# Patient Record
Sex: Female | Born: 1970 | Race: White | Hispanic: No | Marital: Married | Smoking: Never smoker
Health system: Southern US, Community
[De-identification: ages and names within clinical notes are randomized; demographics above are authoritative.]

## PROBLEM LIST (undated history)

## (undated) DIAGNOSIS — G43909 Migraine, unspecified, not intractable, without status migrainosus: Secondary | ICD-10-CM

## (undated) DIAGNOSIS — J45909 Unspecified asthma, uncomplicated: Secondary | ICD-10-CM

## (undated) DIAGNOSIS — K589 Irritable bowel syndrome without diarrhea: Secondary | ICD-10-CM

## (undated) DIAGNOSIS — J309 Allergic rhinitis, unspecified: Secondary | ICD-10-CM

## (undated) HISTORY — PX: BREAST LUMPECTOMY: SHX2

## (undated) HISTORY — PX: MASTECTOMY: SHX3

## (undated) HISTORY — DX: Irritable bowel syndrome, unspecified: K58.9

## (undated) HISTORY — PX: BREAST RECONSTRUCTION WITH PLACEMENT OF TISSUE EXPANDER AND FLEX HD (ACELLULAR HYDRATED DERMIS): SHX6295

## (undated) HISTORY — PX: NASAL SEPTUM SURGERY: SHX37

## (undated) HISTORY — DX: Unspecified asthma, uncomplicated: J45.909

## (undated) HISTORY — DX: Migraine, unspecified, not intractable, without status migrainosus: G43.909

## (undated) HISTORY — PX: CHOLECYSTECTOMY: SHX55

## (undated) HISTORY — PX: BREAST BIOPSY: SHX20

## (undated) HISTORY — PX: NASAL TURBINATE REDUCTION: SHX2072

## (undated) HISTORY — PX: KNEE SURGERY: SHX244

## (undated) HISTORY — DX: Allergic rhinitis, unspecified: J30.9

## (undated) HISTORY — PX: SHOULDER SURGERY: SHX246

---

## 1999-05-06 ENCOUNTER — Inpatient Hospital Stay (HOSPITAL_COMMUNITY): Admission: AD | Admit: 1999-05-06 | Discharge: 1999-05-08 | Payer: Self-pay | Admitting: *Deleted

## 1999-06-21 ENCOUNTER — Other Ambulatory Visit: Admission: RE | Admit: 1999-06-21 | Discharge: 1999-06-21 | Payer: Self-pay | Admitting: Obstetrics and Gynecology

## 2000-07-02 ENCOUNTER — Other Ambulatory Visit: Admission: RE | Admit: 2000-07-02 | Discharge: 2000-07-02 | Payer: Self-pay | Admitting: Obstetrics and Gynecology

## 2001-08-07 ENCOUNTER — Other Ambulatory Visit: Admission: RE | Admit: 2001-08-07 | Discharge: 2001-08-07 | Payer: Self-pay | Admitting: Obstetrics and Gynecology

## 2002-02-04 ENCOUNTER — Encounter: Admission: RE | Admit: 2002-02-04 | Discharge: 2002-02-04 | Payer: Self-pay | Admitting: Obstetrics and Gynecology

## 2002-02-04 ENCOUNTER — Encounter: Payer: Self-pay | Admitting: Obstetrics and Gynecology

## 2002-09-18 ENCOUNTER — Other Ambulatory Visit: Admission: RE | Admit: 2002-09-18 | Discharge: 2002-09-18 | Payer: Self-pay | Admitting: Obstetrics and Gynecology

## 2002-10-04 ENCOUNTER — Emergency Department (HOSPITAL_COMMUNITY): Admission: EM | Admit: 2002-10-04 | Discharge: 2002-10-04 | Payer: Self-pay | Admitting: Emergency Medicine

## 2003-08-10 ENCOUNTER — Emergency Department (HOSPITAL_COMMUNITY): Admission: EM | Admit: 2003-08-10 | Discharge: 2003-08-10 | Payer: Self-pay | Admitting: Emergency Medicine

## 2003-08-10 ENCOUNTER — Encounter: Payer: Self-pay | Admitting: Emergency Medicine

## 2003-09-29 ENCOUNTER — Other Ambulatory Visit: Admission: RE | Admit: 2003-09-29 | Discharge: 2003-09-29 | Payer: Self-pay | Admitting: Obstetrics and Gynecology

## 2004-12-04 ENCOUNTER — Other Ambulatory Visit: Admission: RE | Admit: 2004-12-04 | Discharge: 2004-12-04 | Payer: Self-pay | Admitting: Obstetrics and Gynecology

## 2006-01-08 ENCOUNTER — Other Ambulatory Visit: Admission: RE | Admit: 2006-01-08 | Discharge: 2006-01-08 | Payer: Self-pay | Admitting: Obstetrics and Gynecology

## 2009-08-05 ENCOUNTER — Encounter (INDEPENDENT_AMBULATORY_CARE_PROVIDER_SITE_OTHER): Payer: Self-pay | Admitting: General Surgery

## 2009-08-05 ENCOUNTER — Ambulatory Visit (HOSPITAL_COMMUNITY): Admission: RE | Admit: 2009-08-05 | Discharge: 2009-08-06 | Payer: Self-pay | Admitting: General Surgery

## 2009-10-25 ENCOUNTER — Encounter: Admission: RE | Admit: 2009-10-25 | Discharge: 2009-10-25 | Payer: Self-pay | Admitting: Orthopedic Surgery

## 2011-03-31 LAB — DIFFERENTIAL
Basophils Absolute: 0 10*3/uL (ref 0.0–0.1)
Basophils Relative: 1 % (ref 0–1)
Monocytes Relative: 9 % (ref 3–12)
Neutro Abs: 2.4 10*3/uL (ref 1.7–7.7)
Neutrophils Relative %: 58 % (ref 43–77)

## 2011-03-31 LAB — COMPREHENSIVE METABOLIC PANEL
ALT: 8 U/L (ref 0–35)
AST: 20 U/L (ref 0–37)
Albumin: 4.1 g/dL (ref 3.5–5.2)
Alkaline Phosphatase: 52 U/L (ref 39–117)
BUN: 9 mg/dL (ref 6–23)
CO2: 26 mEq/L (ref 19–32)
Calcium: 9.1 mg/dL (ref 8.4–10.5)
Chloride: 108 mEq/L (ref 96–112)
Creatinine, Ser: 0.82 mg/dL (ref 0.4–1.2)
GFR calc Af Amer: >60 mL/min (ref >60)
GFR calc non Af Amer: >60 mL/min (ref >60)
Glucose, Bld: 87 mg/dL (ref 70–99)
Potassium: 4 mEq/L (ref 3.5–5.1)
Sodium: 140 mEq/L (ref 135–145)
Total Bilirubin: 0.8 mg/dL (ref 0.3–1.2)
Total Protein: 6.5 g/dL (ref 6.0–8.3)

## 2011-03-31 LAB — CBC
HCT: 40.5 % (ref 36.0–46.0)
Hemoglobin: 14 g/dL (ref 12.0–15.0)
MCHC: 34.5 g/dL (ref 30.0–36.0)
MCV: 87.8 fL (ref 78.0–100.0)
Platelets: 185 10*3/uL (ref 150–400)
RBC: 4.61 MIL/uL (ref 3.87–5.11)
RDW: 12.5 % (ref 11.5–15.5)
WBC: 4.2 10*3/uL (ref 4.0–10.5)

## 2011-05-08 NOTE — Op Note (Signed)
NAMEPRINCETTA, Tammy Stuart                ACCOUNT NO.:  [id number]   MEDICAL RECORD NO.:  [id number]          PATIENT TYPE:  OIB   LOCATION:  5156                         FACILITY:  [facility]   PHYSICIAN:  Ollen Gross. Vernell Morgans, M.D. DATE OF BIRTH:  07/24/1971   DATE OF PROCEDURE:  08/05/2009  DATE OF DISCHARGE:                               OPERATIVE REPORT   PREOPERATIVE DIAGNOSIS:  Biliary dyskinesia.   POSTOPERATIVE DIAGNOSIS:  Biliary dyskinesia.   PROCEDURE:  Laparoscopic cholecystectomy with intraoperative  cholangiogram.   SURGEON:  Ollen Gross. Vernell Morgans, MD   ASSISTANT:  Velora Heckler, MD   ANESTHESIA:  General endotracheal.   PROCEDURE IN DETAIL:  After informed consent was obtained, the patient  was brought to the operating room, placed in the supine position on the  operating table.  After adequate induction of general anesthesia, the  patient's abdomen was prepped with Betadine and draped in usual sterile  manner.  The area below the umbilicus was infiltrated with 0.25%  Marcaine.  A small incision was made with a #15 blade knife.  This  incision was carried down through the subcutaneous tissue bluntly with  hemostat and [employer] retractors until the linea alba was identified.  The linea alba was incised with a #15 blade knife.  Each side was  grasped with Kocher clamps and elevated anteriorly.  The preperitoneal  space was then probed bluntly with a hemostat until the peritoneum was  opened and access was gained to the abdominal cavity.  A 0 Vicryl  pursestring stitch was placed in the fascia around the opening.  Hasson  cannula was placed through the opening and anchored in place with a  previously placed Vicryl pursestring stitch.  The abdomen was then  insufflated with carbon dioxide without difficulty.  The patient was  placed in reverse Trendelenburg position and rotated with the right side  up.  The laparoscope was inserted through the Hasson cannula and the  right upper  quadrant was inspected.  The dome of the gallbladder and  liver were readily identified.  Next, the epigastric region was  infiltrated with 0.25% Marcaine.  A small incision was made with #15  blade knife.  A 10-mm port was placed bluntly through this incision into  the abdominal cavity under direct vision.  Sites were then chosen  laterally on the right side of the abdomen for placement of 5-mm ports.  Each of these areas were infiltrated with 0.25% Marcaine.  Small stab  incisions were made with #15 blade knife.  5 mm ports were then placed  bluntly through these incisions into the abdominal cavity under direct  vision.  Blunt grasper was placed through the lateral most 5-mm port and  used to grasp the dome of the gallbladder and elevated anteriorly and  superiorly.  Another blunt grasper was placed through the other 5-mm  port and used to retract on the body and neck of the gallbladder.  A  dissector was placed through the epigastric port.  Using the  electrocautery, the peritoneal reflection at the gallbladder neck area  was opened.  Blunt dissection was then carried out in this area until  the gallbladder neck cystic duct junction was readily identified and a  good window was created.  Single clip was placed on the gallbladder  neck.  A small ductotomy was made just below the clip with a  laparoscopic scissors.  A 14-gauge angiocath was placed percutaneously  through the anterior abdominal wall under direct vision.  A Reddick  cholangiogram catheter was placed through the angiocath and flushed.  The Reddick catheter was then placed within the cystic duct and anchored  in place with a clip.  Cholangiogram was obtained that showed no filling  defects, good emptying in the duodenum, and adequate length on the  cystic duct.  The anchoring clip and catheters were removed from the  patient.  Three clips were placed proximally on the cystic duct and duct  was divided between the two sets of  clips.  Posterior to this, the  cystic artery was identified and again dissected bluntly in a  circumferential manner until a good window was created.  Two clips were  placed proximally in the artery and one distally, the artery was divided  between the two.  Next, the laparoscopic hook cautery device was used to  separate the gallbladder from the liver bed.  Prior to completely  detaching the gallbladder from the liver bed, the liver bed was  inspected and it appeared to be hemostatic.  The gallbladder was then  detached and wrested away from the liver bed without difficulty.  Laparoscopic bag was inserted through this epigastric port.  The  gallbladder was placed in the bag and the bag was sealed.  The abdomen  was then irrigated with copious amounts of saline until the effluent was  clear.  The patient's lower abdomen was inspected and there did not  appear to be any visual evidence of hernia.  The laparoscope was then  moved to the epigastric port.  A gallbladder grasper was placed through  the Hasson cannula and used to grasp the opening of the bag.  The bag  with the gallbladder was removed through the infraumbilical port with  the Hasson cannula without difficulty.  The fascial defect was closed  with previously placed Vicryl purse-string stitch as well as with  another figure-of-eight 0 Vicryl stitch.  The rest of the ports were  then removed under direct vision and were found to be hemostatic.  The  gas was allowed to escape.  The skin incisions were all closed with  interrupted 4-0 Monocryl subcuticular stitches.  Dermabond dressings  were applied.  The patient tolerated the procedure well.  At the end of  the case, all needle, sponge, and instrument counts were correct.  The  patient was then awakened and taken to recovery in stable condition.      Ollen Gross. Vernell Morgans, M.D.  Electronically Signed     PST/MEDQ  D:  08/05/2009  T:  08/06/2009  Job:  [id number]

## 2017-03-12 ENCOUNTER — Encounter: Payer: Self-pay | Admitting: Neurology

## 2017-03-12 ENCOUNTER — Other Ambulatory Visit: Payer: Self-pay | Admitting: *Deleted

## 2017-03-12 DIAGNOSIS — G5601 Carpal tunnel syndrome, right upper limb: Secondary | ICD-10-CM

## 2017-03-26 ENCOUNTER — Ambulatory Visit (INDEPENDENT_AMBULATORY_CARE_PROVIDER_SITE_OTHER): Payer: BLUE CROSS/BLUE SHIELD | Admitting: Neurology

## 2017-03-26 DIAGNOSIS — R202 Paresthesia of skin: Secondary | ICD-10-CM

## 2017-03-26 DIAGNOSIS — G5601 Carpal tunnel syndrome, right upper limb: Secondary | ICD-10-CM | POA: Diagnosis not present

## 2017-03-26 NOTE — Procedures (Signed)
[facility]  [address] [place], [address]  [place], [place] [address] Tel: [phone] Fax:  [phone] Test Date:  03/26/2017  Patient: Tammy Stuart DOB: 05/26/1971 Physician: Nita Sickle, DO  Sex: Female Height:  Ref Phys: Burnell Blanks, M.D.  ID#: [id number] Temp: 33.6C Technician:    Patient Complaints: This is a 46 year-old female referred for evaluation of right elbow pain and numbness involving the hand.  NCV & EMG Findings: Extensive electrodiagnostic testing of the right upper extremity shows:  1. Right median, ulnar, dorsal ulnar cutaneous, and mixed palmer sensory responses are within normal limits. 2. Right median and ulnar motor responses are within normal limits. 3. There is no evidence of active or chronic motor axon loss changes affecting any of the tested muscles. Motor unit configuration and recruitment pattern is within normal limits.  Impression: This is a normal study of the right upper extremity.  In particular, there is no evidence of an ulnar neuropathy, carpal tunnel syndrome, or cervical radiculopathy affecting the right upper extremity.   ___________________________ Nita Sickle, DO    Nerve Conduction Studies Anti Sensory Summary Table   Stim Site NR Peak (ms) Norm Peak (ms) P-T Amp (V) Norm P-T Amp  Right DorsCutan Anti Sensory (Dorsum 5th MC)  Wrist    1.9 <3.1 24.4 >12  Right Median Anti Sensory (2nd Digit)  Wrist    3.2 <3.4 42.0 >20  Right Ulnar Anti Sensory (5th Digit)  Wrist    2.9 <3.1 34.8 >12   Motor Summary Table   Stim Site NR Onset (ms) Norm Onset (ms) O-P Amp (mV) Norm O-P Amp Site1 Site2 Delta-0 (ms) Dist (cm) Vel (m/s) Norm Vel (m/s)  Right Median Motor (Abd Poll Brev)  Wrist    2.9 <3.9 13.5 >6 Elbow Wrist 4.7 27.5 59 >50  Elbow    7.6  13.5         Right Ulnar Motor (Abd Dig Minimi)  Wrist    2.4 <3.1 11.3 >7 B Elbow Wrist 4.1 23.0 56 >50  B Elbow    6.5  11.3  A Elbow B Elbow 1.7 10.0 59 >50  A Elbow     8.2  10.0          Comparison Summary Table   Stim Site NR Peak (ms) Norm Peak (ms) P-T Amp (V) Site1 Site2 Delta-P (ms) Norm Delta (ms)  Right Median/Ulnar Palm Comparison (Wrist - 8cm)  Median Palm    1.8 <2.2 62.8 Median Palm Ulnar Palm 0.0   Ulnar Palm    1.8 <2.2 26.4       EMG   Side Muscle Ins Act Fibs Psw Fasc Number Recrt Dur Dur. Amp Amp. Poly Poly. Comment  Right 1stDorInt Nml Nml Nml Nml Nml Nml Nml Nml Nml Nml Nml Nml N/A  Right Ext Indicis Nml Nml Nml Nml Nml Nml Nml Nml Nml Nml Nml Nml N/A  Right PronatorTeres Nml Nml Nml Nml Nml Nml Nml Nml Nml Nml Nml Nml N/A  Right Biceps Nml Nml Nml Nml Nml Nml Nml Nml Nml Nml Nml Nml N/A  Right Triceps Nml Nml Nml Nml Nml Nml Nml Nml Nml Nml Nml Nml N/A  Right Deltoid Nml Nml Nml Nml Nml Nml Nml Nml Nml Nml Nml Nml N/A  Right ABD Dig Min Nml Nml Nml Nml Nml Nml Nml Nml Nml Nml Nml Nml N/A  Right FlexDigProf 4,5 Nml Nml Nml Nml Nml Nml Nml Nml Nml Nml Nml  Nml N/A      Waveforms:

## 2017-10-25 DIAGNOSIS — R072 Precordial pain: Secondary | ICD-10-CM

## 2017-10-25 HISTORY — DX: Precordial pain: R07.2

## 2017-10-28 ENCOUNTER — Ambulatory Visit: Payer: BLUE CROSS/BLUE SHIELD | Admitting: Cardiology

## 2017-10-28 ENCOUNTER — Encounter: Payer: Self-pay | Admitting: Cardiology

## 2017-10-28 DIAGNOSIS — F5101 Primary insomnia: Secondary | ICD-10-CM

## 2017-10-28 DIAGNOSIS — R0600 Dyspnea, unspecified: Secondary | ICD-10-CM

## 2017-10-28 DIAGNOSIS — R0609 Other forms of dyspnea: Secondary | ICD-10-CM | POA: Diagnosis not present

## 2017-10-28 DIAGNOSIS — R0789 Other chest pain: Secondary | ICD-10-CM | POA: Diagnosis not present

## 2017-10-28 DIAGNOSIS — G4700 Insomnia, unspecified: Secondary | ICD-10-CM

## 2017-10-28 DIAGNOSIS — Z9013 Acquired absence of bilateral breasts and nipples: Secondary | ICD-10-CM | POA: Diagnosis not present

## 2017-10-28 HISTORY — DX: Acquired absence of bilateral breasts and nipples: Z90.13

## 2017-10-28 HISTORY — DX: Dyspnea, unspecified: R06.00

## 2017-10-28 HISTORY — DX: Other forms of dyspnea: R06.09

## 2017-10-28 HISTORY — DX: Insomnia, unspecified: G47.00

## 2017-10-28 HISTORY — DX: Other chest pain: R07.89

## 2017-10-28 NOTE — Progress Notes (Signed)
Cardiology Consultation:    Date:  10/28/2017   ID:  Tammy Stuart, DOB 07-27-1971, MRN [id number]  PCP:  Nonnie DoneSlatosky, John J., MD  Cardiologist:  Gypsy Balsamobert Krasowski, MD   Referring MD: Nonnie DoneSlatosky, John J., MD   Chief Complaint  Patient presents with  . Chest Pain  I am having chest pain  History of Present Illness:    Tammy Stuart is a 46 y.o. female who is being seen today for the evaluation of chest pain at the request of Slatosky, Excell SeltzerJohn J., MD.  For last few months she is being experiencing chest pain.  Pain is pressure-like located in the left side of her chest without radiation except sometimes to the Joe.  That sensation is continued lasting 4 days.  She graded this as 6 in scale up to 10.  Moving her shoulder taking the breath twisting her body does not make any difference.  She used to be very athletic and exercise a lot after that she had children and now she decided to go back to exercises.  She goes to gym once a week exercise quite heavily and have no difficulty doing it.  Denies having any chest pain during that time.  She does not have risk factors for coronary artery disease.  No family history of premature coronary artery disease, no diabetes, no hypertension, she never smoked, she does not know what her cholesterol is.  Past Medical History:  Diagnosis Date  . Allergic rhinitis   . Asthma   . IBS (irritable bowel syndrome)   . Migraines     Past Surgical History:  Procedure Laterality Date  . BREAST BIOPSY    . BREAST LUMPECTOMY    . BREAST RECONSTRUCTION WITH PLACEMENT OF TISSUE EXPANDER AND FLEX HD (ACELLULAR HYDRATED DERMIS)    . CHOLECYSTECTOMY    . KNEE SURGERY    . MASTECTOMY Bilateral   . NASAL SEPTUM SURGERY    . NASAL TURBINATE REDUCTION    . SHOULDER SURGERY Bilateral     Current Medications: Current Meds  Medication Sig  . desloratadine (CLARINEX) 5 MG tablet Take 1 tablet by mouth daily.  . montelukast (SINGULAIR) 10 MG tablet Take 1 tablet by  mouth daily.  . nitroGLYCERIN (NITROSTAT) 0.4 MG SL tablet Place 0.4 mg under the tongue every 5 (five) minutes as needed for chest pain.     Allergies:   Hydrocodone and Oxycodone-acetaminophen   Social History   Socioeconomic History  . Marital status: Married    Spouse name: None  . Number of children: None  . Years of education: None  . Highest education level: None  Social Needs  . Financial resource strain: None  . Food insecurity - worry: None  . Food insecurity - inability: None  . Transportation needs - medical: None  . Transportation needs - non-medical: None  Occupational History  . None  Tobacco Use  . Smoking status: Never Smoker  . Smokeless tobacco: Never Used  Substance and Sexual Activity  . Alcohol use: No    Frequency: Never  . Drug use: No  . Sexual activity: None  Other Topics Concern  . None  Social History Narrative  . None     Family History: The patient's family history includes Breast cancer in her maternal aunt, mother, and paternal grandmother; Cervical cancer in her maternal grandmother; Hypertension in her father; Liver cancer in her maternal grandfather; Lung cancer in her maternal grandfather; Migraines in her father; Prostate cancer in  her maternal uncle. ROS:   Please see the history of present illness.    All 14 point review of systems negative except as described per history of present illness.  EKGs/Labs/Other Studies Reviewed:    The following studies were reviewed today:     Recent Labs: No results found for requested labs within last 8760 hours.  Recent Lipid Panel No results found for: CHOL, TRIG, HDL, CHOLHDL, VLDL, LDLCALC, LDLDIRECT  Physical Exam:    VS:  BP 106/64   Pulse 68   Resp 10   Ht 5\' 4"  (1.626 m)   Wt 131 lb (59.4 kg)   BMI 22.49 kg/m     Wt Readings from Last 3 Encounters:  10/28/17 131 lb (59.4 kg)     GEN:  Well nourished, well developed in no acute distress HEENT: Normal NECK: No JVD; No  carotid bruits LYMPHATICS: No lymphadenopathy CARDIAC: RRR, no murmurs, no rubs, no gallops RESPIRATORY:  Clear to auscultation without rales, wheezing or rhonchi  ABDOMEN: Soft, non-tender, non-distended MUSCULOSKELETAL:  No edema; No deformity  SKIN: Warm and dry NEUROLOGIC:  Alert and oriented x 3 PSYCHIATRIC:  Normal affect   ASSESSMENT:    1. Atypical chest pain   2. Primary insomnia   3. Status post bilateral mastectomy   4. Dyspnea on exertion    PLAN:    In order of problems listed above:  1. Atypical chest pain: Not related to exercise, lasting 4 days, no shortness of breath no sweating associated with this sensation.  I have overall very low risk of suspicion for her having significant heart problem.  We will ask her to have a EKG and after that I will decide about type of stress test that she needs to get.  I do this mostly for encouragement and approach to improving her that there is no problem.  If that is negative we will re-oriented Alic golf on modification of risk factors for coronary artery disease.  She will have fasting lipid profile done. 2. Dyspnea on exertion: Very mild will do stress test and see objectively how much she is able to do 3. 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 4. Status post bilateral mastectomy: That was preventive multiple family members have breast cancer eventually she decided to go for bilateral mastectomy that was 4 years ago since that time she is very happy and doing well.   Medication Adjustments/Labs and Tests Ordered: Current medicines are reviewed at length with the patient today.  Concerns regarding medicines are outlined above.  No orders of the defined types were placed in this encounter.  No orders of the defined types were placed in this encounter.   Signed, Georgeanna Lea, MD, [facility]. 10/28/2017 2:08 PM    [facility]

## 2017-10-28 NOTE — Patient Instructions (Addendum)
Medication Instructions:  Your physician recommends that you continue on your current medications as directed. Please refer to the Current Medication list given to you today.  Labwork: None  Testing/Procedures:  Exercise Stress Echocardiogram, Care After This sheet gives you information about how to care for yourself after your procedure. Your doctor may also give you more specific instructions. If you have problems or questions, call your doctor. Follow these instructions at home:  You may do these things as told by your doctor: ? Eat what you normally eat. ? Do your normal activities. ? Take your normal medicines.  Take over-the-counter and prescription medicines only as told by your doctor.  Keep all follow-up visits as told by your doctor. This is important. Contact a doctor if:  You keep feeling dizzy or light-headed.  You feel like your heart is beating fast.  You keep feeling sick to your stomach (nauseous) or you throw up (vomit).  You have a headache.  You feel short of breath. Get help right away if:  You have pain or pressure in any of these areas: ? Your chest. ? Your jaw or neck. ? Between your shoulder blades. ? Down your left arm.  You pass out (faint).  You have trouble breathing. Summary  After your procedure, you may eat like normal, do your normal activities, and take your normal medicines as told by your doctor.  Contact your doctor if you have dizziness, a fast heartbeat, or a headache.  You should also contact your doctor if you feel sick to your stomach (nauseous), you throw up (vomit), or you feel short of breath.  Get help right away if you feel pain or pressure in any of these areas: your chest, jaw, neck, between your shoulder blades, or down your right arm.  You should also get help right away if you pass out (faint) or have trouble breathing. This information is not intended to replace advice given to you by your health care provider.  Make sure you discuss any questions you have with your health care provider. Document Released: 09/30/2013 Document Revised: 09/03/2016 Document Reviewed: 09/03/2016 Elsevier Interactive Patient Education  2017 [employer]   EKG today in office.   Follow-Up: Your physician recommends that you schedule a follow-up appointment in: 1 month   Any Other Special Instructions Will Be Listed Below (If Applicable).  Please note that any paperwork needing to be filled out by the provider will need to be addressed at the front desk prior to seeing the provider. Please note that any paperwork FMLA, Disability or other documents regarding health condition is subject to a $25.00 charge that must be received prior to completion of paperwork in the form of a money order or check.    If you need a refill on your cardiac medications before your next appointment, please call your pharmacy.

## 2017-11-19 ENCOUNTER — Ambulatory Visit (HOSPITAL_BASED_OUTPATIENT_CLINIC_OR_DEPARTMENT_OTHER): Payer: BLUE CROSS/BLUE SHIELD

## 2017-11-28 ENCOUNTER — Ambulatory Visit: Payer: BLUE CROSS/BLUE SHIELD | Admitting: Cardiology

## 2018-05-15 DIAGNOSIS — J4520 Mild intermittent asthma, uncomplicated: Secondary | ICD-10-CM

## 2018-05-15 DIAGNOSIS — T7840XA Allergy, unspecified, initial encounter: Secondary | ICD-10-CM

## 2018-05-15 DIAGNOSIS — R918 Other nonspecific abnormal finding of lung field: Secondary | ICD-10-CM

## 2018-05-15 DIAGNOSIS — R062 Wheezing: Secondary | ICD-10-CM

## 2018-05-15 HISTORY — DX: Other nonspecific abnormal finding of lung field: R91.8

## 2018-05-15 HISTORY — DX: Allergy, unspecified, initial encounter: T78.40XA

## 2018-05-15 HISTORY — DX: Wheezing: R06.2

## 2018-05-15 HISTORY — DX: Mild intermittent asthma, uncomplicated: J45.20

## 2018-08-18 DIAGNOSIS — J3489 Other specified disorders of nose and nasal sinuses: Secondary | ICD-10-CM

## 2018-08-18 HISTORY — DX: Other specified disorders of nose and nasal sinuses: J34.89

## 2018-12-10 ENCOUNTER — Ambulatory Visit: Payer: Self-pay | Admitting: Allergy

## 2019-11-09 DIAGNOSIS — M65341 Trigger finger, right ring finger: Secondary | ICD-10-CM

## 2019-11-09 DIAGNOSIS — I7300 Raynaud's syndrome without gangrene: Secondary | ICD-10-CM

## 2019-11-09 HISTORY — DX: Trigger finger, right ring finger: M65.341

## 2019-11-09 HISTORY — DX: Raynaud's syndrome without gangrene: I73.00

## 2020-01-08 DIAGNOSIS — H9313 Tinnitus, bilateral: Secondary | ICD-10-CM

## 2020-01-08 DIAGNOSIS — G43509 Persistent migraine aura without cerebral infarction, not intractable, without status migrainosus: Secondary | ICD-10-CM

## 2020-01-08 HISTORY — DX: Tinnitus, bilateral: H93.13

## 2020-01-08 HISTORY — DX: Persistent migraine aura without cerebral infarction, not intractable, without status migrainosus: G43.509

## 2020-06-20 ENCOUNTER — Ambulatory Visit: Payer: BC Managed Care – PPO | Attending: Internal Medicine

## 2021-02-20 DIAGNOSIS — U071 COVID-19: Secondary | ICD-10-CM

## 2021-02-20 DIAGNOSIS — J1282 Pneumonia due to coronavirus disease 2019: Secondary | ICD-10-CM | POA: Insufficient documentation

## 2021-02-20 HISTORY — DX: Pneumonia due to coronavirus disease 2019: J12.82

## 2021-02-20 HISTORY — DX: COVID-19: U07.1

## 2021-04-23 DIAGNOSIS — U071 COVID-19: Secondary | ICD-10-CM | POA: Insufficient documentation

## 2021-05-15 ENCOUNTER — Emergency Department (HOSPITAL_COMMUNITY): Payer: BC Managed Care – PPO

## 2021-05-15 ENCOUNTER — Encounter (HOSPITAL_COMMUNITY): Payer: Self-pay

## 2021-05-15 ENCOUNTER — Other Ambulatory Visit: Payer: Self-pay

## 2021-05-15 ENCOUNTER — Emergency Department (HOSPITAL_COMMUNITY)
Admission: EM | Admit: 2021-05-15 | Discharge: 2021-05-15 | Disposition: A | Discharge status: Home / Self Care | Payer: BC Managed Care – PPO | Attending: Physician Assistant | Admitting: Physician Assistant

## 2021-05-15 DIAGNOSIS — J45909 Unspecified asthma, uncomplicated: Secondary | ICD-10-CM | POA: Insufficient documentation

## 2021-05-15 DIAGNOSIS — R0789 Other chest pain: Secondary | ICD-10-CM | POA: Diagnosis not present

## 2021-05-15 DIAGNOSIS — Z5321 Procedure and treatment not carried out due to patient leaving prior to being seen by health care provider: Secondary | ICD-10-CM | POA: Diagnosis not present

## 2021-05-15 DIAGNOSIS — R5383 Other fatigue: Secondary | ICD-10-CM | POA: Diagnosis not present

## 2021-05-15 DIAGNOSIS — M542 Cervicalgia: Secondary | ICD-10-CM | POA: Insufficient documentation

## 2021-05-15 DIAGNOSIS — R0602 Shortness of breath: Secondary | ICD-10-CM | POA: Diagnosis present

## 2021-05-15 NOTE — ED Notes (Signed)
Pt left AMA °

## 2021-05-15 NOTE — ED Triage Notes (Signed)
Pt reports while in [place] a little over a week ago she began having chest tightness and SOB, feels like her neck is swollen and cant swallow good, no energy and exhausted. Pt has taken Prednisone pack, 875mg  amoxicillin twice a day, Breo and albuterol inhaler as needed with no relief

## 2021-05-15 NOTE — ED Provider Notes (Signed)
Emergency Medicine Provider Triage Evaluation Note  Tammy Stuart , a 50 y.o. female  was evaluated in triage.  Pt complains of shortness of breath, chest tightness, fatigue and neck pain.  This was due to asthma and has been on amoxicillin and prednisone for the past week.  Minimal improvement noted with this or inhalers.  Denies any chest.  Review of Systems  Positive: Shortness of breath, wheezing, cough Negative: Chest pain, neck stiffness  Physical Exam  There were no vitals taken for this visit. Gen:   Awake, no distress   Resp:  Normal effort, no wheezing, no coarse breath sounds MSK:   Moves extremities without difficulty  Other:  No abnormalities of oropharynx. No meningeal signs, no trismus or drooling. No stridor  Medical Decision Making  Medically screening exam initiated at 3:44 PM.  Appropriate orders placed.  Tammy Stuart was informed that the remainder of the evaluation will be completed by another provider, this initial triage assessment does not replace that evaluation, and the importance of remaining in the ED until their evaluation is complete.  Will order EKG and CXR.   Tammy Pates, PA-C 05/15/21 1546    Tammy Logan, DO 05/15/21 1615

## 2021-05-23 ENCOUNTER — Encounter: Payer: Self-pay | Admitting: Cardiology

## 2021-05-23 ENCOUNTER — Encounter: Payer: Self-pay | Admitting: *Deleted

## 2021-06-14 ENCOUNTER — Ambulatory Visit (INDEPENDENT_AMBULATORY_CARE_PROVIDER_SITE_OTHER): Payer: BC Managed Care – PPO | Admitting: Cardiology

## 2021-06-14 ENCOUNTER — Other Ambulatory Visit: Payer: Self-pay

## 2021-06-14 VITALS — BP 120/70 | HR 75 | Ht 64.0 in | Wt 128.0 lb

## 2021-06-14 DIAGNOSIS — Z9013 Acquired absence of bilateral breasts and nipples: Secondary | ICD-10-CM

## 2021-06-14 DIAGNOSIS — R0600 Dyspnea, unspecified: Secondary | ICD-10-CM | POA: Diagnosis not present

## 2021-06-14 DIAGNOSIS — R0789 Other chest pain: Secondary | ICD-10-CM

## 2021-06-14 DIAGNOSIS — J4520 Mild intermittent asthma, uncomplicated: Secondary | ICD-10-CM

## 2021-06-14 DIAGNOSIS — Z7189 Other specified counseling: Secondary | ICD-10-CM

## 2021-06-14 DIAGNOSIS — R0609 Other forms of dyspnea: Secondary | ICD-10-CM

## 2021-06-14 MED ORDER — METOPROLOL TARTRATE 100 MG PO TABS: 100.0000 mg | ORAL_TABLET | Freq: Once | ORAL | 0 refills | Status: DC

## 2021-06-14 NOTE — Patient Instructions (Signed)
Medication Instructions:  Your physician recommends that you continue on your current medications as directed. Please refer to the Current Medication list given to you today.  *If you need a refill on your cardiac medications before your next appointment, please call your pharmacy*   Lab Work: TODAY -  Lipids   Your physician recommends that you return for lab work in: Within one week of your cardiac CT  BMP   If you have labs (blood work) drawn today and your tests are completely normal, you will receive your results only by: MyChart Message (if you have MyChart) OR A paper copy in the mail If you have any lab test that is abnormal or we need to change your treatment, we will call you to review the results.   Testing/Procedures: Your physician has requested that you have an echocardiogram. Echocardiography is a painless test that uses sound waves to create images of your heart. It provides your doctor with information about the size and shape of your heart and how well your heart's chambers and valves are working. This procedure takes approximately one hour. There are no restrictions for this procedure.  Your cardiac CT will be scheduled at the below location:   [facility] [address] [place], [place] [address] [phone]  If scheduled at [facility], please arrive at the [facility] main entrance (entrance A) of [facility] 30 minutes prior to test start time. Proceed to the [facility] Radiology Department (first floor) to check-in and test prep.  Please follow these instructions carefully (unless otherwise directed):  On the Night Before the Test: Be sure to Drink plenty of water. Do not consume any caffeinated/decaffeinated beverages or chocolate 12 hours prior to your test. Do not take any antihistamines 12 hours prior to your test.  On the Day of the Test: Drink plenty of water until 1 hour prior to the test. Do not eat any food 4  hours prior to the test. You may take your regular medications prior to the test.  Take metoprolol (Lopressor) two hours prior to test. FEMALES- please wear underwire-free bra if available       After the Test: Drink plenty of water. After receiving IV contrast, you may experience a mild flushed feeling. This is normal. On occasion, you may experience a mild rash up to 24 hours after the test. This is not dangerous. If this occurs, you can take Benadryl 25 mg and increase your fluid intake. If you experience trouble breathing, this can be serious. If it is severe call 911 IMMEDIATELY. If it is mild, please call our office. If you take any of these medications: Glipizide/Metformin, Avandament, Glucavance, please do not take 48 hours after completing test unless otherwise instructed.   Once we have confirmed authorization from your insurance company, we will call you to set up a date and time for your test. Based on how quickly your insurance processes prior authorizations requests, please allow up to 4 weeks to be contacted for scheduling your Cardiac CT appointment. Be advised that routine Cardiac CT appointments could be scheduled as many as 8 weeks after your provider has ordered it.  For non-scheduling related questions, please contact the cardiac imaging nurse navigator should you have any questions/concerns: Rockwell Alexandria, Cardiac Imaging Nurse Navigator Larey Brick, Cardiac Imaging Nurse Navigator [facility] Direct Office Dial: [phone]   For scheduling needs, including cancellations and rescheduling, please call [place], [phone].  Follow-Up: At [facility], you and your health needs are our priority.  As part of our continuing mission to provide you with exceptional heart care, we have created designated Provider Care Teams.  These Care Teams include your primary Cardiologist (physician) and Advanced Practice Providers (APPs -  Physician  Assistants and Nurse Practitioners) who all work together to provide you with the care you need, when you need it.  We recommend signing up for the patient portal called "MyChart".  Sign up information is provided on this After Visit Summary.  MyChart is used to connect with patients for Virtual Visits (Telemedicine).  Patients are able to view lab/test results, encounter notes, upcoming appointments, etc.  Non-urgent messages can be sent to your provider as well.   To learn more about what you can do with MyChart, go to [email or web address].    Your next appointment:   2 month(s)  The format for your next appointment:   In Person  Provider:   Gypsy Balsam, MD   Other Instructions

## 2021-06-14 NOTE — Progress Notes (Signed)
Cardiology Consultation:    Date:  06/14/2021   ID:  Tammy Stuart, DOB [birth year], MRN [id number]  PCP:  Nonnie Done., MD  Cardiologist:  Gypsy Balsam, MD   Referring MD: Nonnie Done., MD   Chief Complaint  Patient presents with    L sided and neck pain    6 months    History of Present Illness:    Tammy Stuart is a 50 y.o. female who is being seen today for the evaluation of shortness of breath and chest pain at the request of [place], Excell Seltzer., MD. she is a nice lady with past medical history significant for mild asthma, irritable bowel syndrome, Raynaud's phenomenon, bilateral mastectomy done 10 years ago she was referred to [place] because of fatigue as well as chest pain.  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  For about 6 months she noticed increased shortness of breath.  She said when she goes to guarding comes back she will get short of breath there is no swelling of lower extremities there is no proximal nocturnal dyspnea.  She also described to have chest pain pain is typically located on the left side with some radiation towards the arm and happen at different situations sometimes with exercise.  However when she developed this pain she keep going and pain subsides usual sensation last for about 1 to 2 minutes.  There is no shortness of breath there is no sweating associated with this sensation.  Moving arm taking the breath or coughing does not make any difference again that has been going on for about 6 months. She never smoked She does have multiple family members with premature coronary disease She did have bilateral mastectomy done about 10 years ago because of multiple premalignant lesions. 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   IBS (irritable bowel syndrome)    Insomnia 10/28/2017   Migraines    Mild intermittent asthma without complication 05/15/2018   Nasal obstruction 08/18/2018   Persistent migraine aura without cerebral infarction and without status migrainosus, not intractable 01/08/2020   Pneumonia due to COVID-19 virus 02/20/2021   Precordial pain 10/25/2017   Pulmonary nodules 05/15/2018   Raynaud's disease without gangrene 11/09/2019   Status post bilateral mastectomy 10/28/2017   Tinnitus of both ears 01/08/2020   Trigger ring finger of right hand 11/09/2019   Wheezing 05/15/2018    Past Surgical History:  Procedure Laterality Date   BREAST BIOPSY     BREAST LUMPECTOMY     BREAST RECONSTRUCTION WITH PLACEMENT OF TISSUE EXPANDER AND FLEX HD (ACELLULAR HYDRATED DERMIS)     CHOLECYSTECTOMY     KNEE SURGERY     MASTECTOMY Bilateral    NASAL SEPTUM SURGERY     NASAL TURBINATE REDUCTION     SHOULDER SURGERY Bilateral     Current Medications: Current Meds  Medication Sig   albuterol (VENTOLIN HFA) 108 (90 Base) MCG/ACT inhaler Inhale 2 puffs into the lungs every 6 (six) hours as needed for wheezing or shortness of breath.   ARNUITY ELLIPTA 50 MCG/ACT AEPB Inhale 1 puff into the lungs daily.   BREO ELLIPTA 200-25 MCG/INH AEPB  Inhale 1 puff into the lungs daily.   desloratadine (CLARINEX) 5 MG tablet Take 1 tablet by mouth daily.   metoprolol tartrate (LOPRESSOR) 100 MG tablet Take 1 tablet (100 mg total) by mouth once for 1 dose. Take two hours prior to your cardiac CT   montelukast (SINGULAIR) 10 MG tablet Take 1 tablet by mouth daily.     Allergies:   Hydrocodone and Oxycodone-acetaminophen   Social History   Socioeconomic History   Marital status: Married    Spouse name: Not on file   Number of children: Not on file   Years of education: Not on file   Highest education level: Not on file  Occupational History   Not on file  Tobacco  Use   Smoking status: Never   Smokeless tobacco: Never  Vaping Use   Vaping Use: Never used  Substance and Sexual Activity   Alcohol use: No   Drug use: No   Sexual activity: Not on file  Other Topics Concern   Not on file  Social History Narrative   Not on file   Social Determinants of Health   Financial Resource Strain: Not on file  Food Insecurity: Not on file  Transportation Needs: Not on file  Physical Activity: Not on file  Stress: Not on file  Social Connections: Not on file     Family History: The patient's family history includes Breast cancer in her maternal aunt, mother, and paternal grandmother; Cervical cancer in her maternal grandmother; Hypertension in her father; Liver cancer in her maternal grandfather; Lung cancer in her maternal grandfather; Migraines in her father; Prostate cancer in her maternal uncle. ROS:   Please see the history of present illness.    All 14 point review of systems negative except as described per history of present illness.  EKGs/Labs/Other Studies Reviewed:    The following studies were reviewed today:   EKG:  EKG is  ordered today.  The ekg ordered today demonstrates normal sinus rhythm, normal P interval, normal QS complex duration morphology no ST segment changes  Recent Labs: No results found for requested labs within last 8760 hours.  Recent Lipid Panel No results found for: CHOL, TRIG, HDL, CHOLHDL, VLDL, LDLCALC, LDLDIRECT  Physical Exam:    VS:  BP 120/70 (BP Location: Left Arm, Patient Position: Sitting)   Pulse 75   Ht 5\' 4"  (1.626 m)   Wt 128 lb (58.1 kg)   SpO2 94%   BMI 21.97 kg/m     Wt Readings from Last 3 Encounters:  06/14/21 128 lb (58.1 kg)  05/17/21 129 lb (58.5 kg)  10/28/17 131 lb (59.4 kg)     GEN:  Well nourished, well developed in no acute distress HEENT: Normal NECK: No JVD; No carotid bruits LYMPHATICS: No lymphadenopathy CARDIAC: RRR, no murmurs, no rubs, no gallops RESPIRATORY:   Clear to auscultation without rales, wheezing or rhonchi  ABDOMEN: Soft, non-tender, non-distended MUSCULOSKELETAL:  No edema; No deformity  SKIN: Warm and dry NEUROLOGIC:  Alert and oriented x 3 PSYCHIATRIC:  Normal affect   ASSESSMENT:    1. Dyspnea on exertion   2. Cardiac risk counseling   3. Atypical chest pain   4. Status post bilateral mastectomy   5. Mild intermittent asthma without complication    PLAN:    In order of problems listed above:  Chest pain with some with committee suspicion characteristics.  I did review CT of the chest that she had done 2 months ago there  was no calcification of her coronary arteries but his symptomatology is worrisome enough that I think we need to investigate that, therefore I will schedule him to have coronary CT angio.  I also asked her to start taking 1 baby aspirin a day.  We will do also cholesterol profile today to assess her risk factors Dyspnea on exertion: Echocardiogram will be done to assess left ventricular ejection fraction coronary CT angio to look at her coronary arteries Status post bilateral mastectomy.  Noted Mild intermittent asthma noted.  Followed by pulmonary apparently stable in good condition.   Medication Adjustments/Labs and Tests Ordered: Current medicines are reviewed at length with the patient today.  Concerns regarding medicines are outlined above.  Orders Placed This Encounter  Procedures   CT CORONARY MORPH W/CTA COR W/SCORE W/CA W/CM &/OR WO/CM   Basic metabolic panel   Lipid Profile   EKG 12-Lead   ECHOCARDIOGRAM COMPLETE   Meds ordered this encounter  Medications   metoprolol tartrate (LOPRESSOR) 100 MG tablet    Sig: Take 1 tablet (100 mg total) by mouth once for 1 dose. Take two hours prior to your cardiac CT    Dispense:  1 tablet    Refill:  0    Signed, Georgeanna Lea, MD, [facility]. 06/14/2021 3:11 PM    [facility]

## 2021-06-15 LAB — LIPID PANEL
Chol/HDL Ratio: 2.4 ratio (ref 0.0–4.4)
Cholesterol, Total: 218 mg/dL — ABNORMAL HIGH (ref 100–199)
HDL: 89 mg/dL (ref >39)
LDL Chol Calc (NIH): 116 mg/dL — ABNORMAL HIGH (ref 0–99)
Triglycerides: 76 mg/dL (ref 0–149)
VLDL Cholesterol Cal: 13 mg/dL (ref 5–40)

## 2021-06-30 ENCOUNTER — Telehealth (HOSPITAL_COMMUNITY): Payer: Self-pay | Admitting: Emergency Medicine

## 2021-06-30 NOTE — Telephone Encounter (Signed)
Attempted to call patient regarding upcoming cardiac CT appointment. °Left message on voicemail with name and callback number °Annalis Kaczmarczyk RN Navigator Cardiac Imaging °[facility] °[phone] Office °[phone] Cell ° °

## 2021-07-03 ENCOUNTER — Other Ambulatory Visit: Payer: BC Managed Care – PPO

## 2021-07-03 ENCOUNTER — Other Ambulatory Visit: Payer: Self-pay

## 2021-07-03 DIAGNOSIS — M542 Cervicalgia: Secondary | ICD-10-CM | POA: Insufficient documentation

## 2021-07-03 DIAGNOSIS — R030 Elevated blood-pressure reading, without diagnosis of hypertension: Secondary | ICD-10-CM | POA: Insufficient documentation

## 2021-07-03 DIAGNOSIS — R0609 Other forms of dyspnea: Secondary | ICD-10-CM

## 2021-07-03 DIAGNOSIS — M5412 Radiculopathy, cervical region: Secondary | ICD-10-CM | POA: Insufficient documentation

## 2021-07-03 DIAGNOSIS — R0600 Dyspnea, unspecified: Secondary | ICD-10-CM

## 2021-07-04 ENCOUNTER — Other Ambulatory Visit: Payer: Self-pay

## 2021-07-04 ENCOUNTER — Ambulatory Visit (HOSPITAL_COMMUNITY)
Admission: RE | Admit: 2021-07-04 | Discharge: 2021-07-04 | Disposition: A | Discharge status: Home / Self Care | Payer: BC Managed Care – PPO | Source: Ambulatory Visit | Attending: Cardiology | Admitting: Cardiology

## 2021-07-04 ENCOUNTER — Ambulatory Visit (INDEPENDENT_AMBULATORY_CARE_PROVIDER_SITE_OTHER): Payer: BC Managed Care – PPO

## 2021-07-04 DIAGNOSIS — R0609 Other forms of dyspnea: Secondary | ICD-10-CM

## 2021-07-04 DIAGNOSIS — R0600 Dyspnea, unspecified: Secondary | ICD-10-CM | POA: Diagnosis present

## 2021-07-04 DIAGNOSIS — R079 Chest pain, unspecified: Secondary | ICD-10-CM

## 2021-07-04 LAB — BASIC METABOLIC PANEL
BUN/Creatinine Ratio: 16 (ref 9–23)
BUN: 15 mg/dL (ref 6–24)
CO2: 25 mmol/L (ref 20–29)
Calcium: 9.6 mg/dL (ref 8.7–10.2)
Chloride: 102 mmol/L (ref 96–106)
Creatinine, Ser: 0.91 mg/dL (ref 0.57–1.00)
Glucose: 96 mg/dL (ref 65–99)
Potassium: 4.1 mmol/L (ref 3.5–5.2)
Sodium: 142 mmol/L (ref 134–144)
eGFR: 77 mL/min/{1.73_m2} (ref >59)

## 2021-07-04 LAB — ECHOCARDIOGRAM COMPLETE
Area-P 1/2: 3.99 cm2
S' Lateral: 2.8 cm

## 2021-07-04 MED ORDER — NITROGLYCERIN 0.4 MG SL SUBL
SUBLINGUAL_TABLET | SUBLINGUAL | Status: AC
  Filled 2021-07-04: qty 2

## 2021-07-04 MED ORDER — NITROGLYCERIN 0.4 MG SL SUBL
0.8000 mg | SUBLINGUAL_TABLET | Freq: Once | SUBLINGUAL | Status: AC
  Administered 2021-07-04: 0.8 mg via SUBLINGUAL

## 2021-07-04 MED ORDER — IOHEXOL 350 MG/ML SOLN
95.0000 mL | Freq: Once | INTRAVENOUS | Status: AC | PRN
  Administered 2021-07-04: 95 mL via INTRAVENOUS

## 2021-07-04 MED ORDER — METOPROLOL TARTRATE 5 MG/5ML IV SOLN
5.0000 mg | INTRAVENOUS | Status: DC | PRN
  Administered 2021-07-04: 5 mg via INTRAVENOUS

## 2021-07-04 MED ORDER — METOPROLOL TARTRATE 5 MG/5ML IV SOLN
INTRAVENOUS | Status: AC
  Administered 2021-07-04: 5 mg via INTRAVENOUS
  Filled 2021-07-04: qty 10

## 2021-07-04 NOTE — Progress Notes (Addendum)
Complete echocardiogram w/ 3D imaging performed.  Jimmy Bashir Marchetti RDCS, RVT

## 2021-07-14 ENCOUNTER — Other Ambulatory Visit: Payer: Self-pay

## 2021-07-14 DIAGNOSIS — G43909 Migraine, unspecified, not intractable, without status migrainosus: Secondary | ICD-10-CM | POA: Insufficient documentation

## 2021-07-14 DIAGNOSIS — K589 Irritable bowel syndrome without diarrhea: Secondary | ICD-10-CM | POA: Insufficient documentation

## 2021-07-14 DIAGNOSIS — J309 Allergic rhinitis, unspecified: Secondary | ICD-10-CM | POA: Insufficient documentation

## 2021-07-14 DIAGNOSIS — J45909 Unspecified asthma, uncomplicated: Secondary | ICD-10-CM | POA: Insufficient documentation

## 2021-07-17 ENCOUNTER — Telehealth: Payer: Self-pay

## 2021-07-17 ENCOUNTER — Other Ambulatory Visit: Payer: Self-pay

## 2021-07-17 ENCOUNTER — Encounter: Payer: Self-pay | Admitting: Cardiology

## 2021-07-17 ENCOUNTER — Ambulatory Visit (INDEPENDENT_AMBULATORY_CARE_PROVIDER_SITE_OTHER): Payer: BC Managed Care – PPO | Admitting: Cardiology

## 2021-07-17 VITALS — BP 130/80 | HR 68 | Ht 64.0 in | Wt 127.0 lb

## 2021-07-17 DIAGNOSIS — Q211 Atrial septal defect, unspecified: Secondary | ICD-10-CM

## 2021-07-17 DIAGNOSIS — R0600 Dyspnea, unspecified: Secondary | ICD-10-CM

## 2021-07-17 DIAGNOSIS — R0789 Other chest pain: Secondary | ICD-10-CM

## 2021-07-17 DIAGNOSIS — I7300 Raynaud's syndrome without gangrene: Secondary | ICD-10-CM

## 2021-07-17 DIAGNOSIS — Q2111 Secundum atrial septal defect: Secondary | ICD-10-CM | POA: Insufficient documentation

## 2021-07-17 DIAGNOSIS — R0609 Other forms of dyspnea: Secondary | ICD-10-CM

## 2021-07-17 NOTE — Telephone Encounter (Signed)
Spoke with the patient just now and let her know Dr. Vanetta Shawl recommendations. She verbalizes understanding and states that Dr. Bing Matter actually called her regarding this as well and discussed the procedure with her. I went over the below instructions with her and she verbalizes understanding.   You are scheduled for a TEE on 07/25/2021 with Dr. Elease Hashimoto.  Please arrive at the [facility] (Main Entrance A) at [facility] ([address]): [address] [place], [place] [address] at 12 pm.   DIET: Nothing to eat or drink after midnight except a sip of water with medications (see medication instructions below)  FYI: For your safety, and to allow [place] to monitor your vital signs accurately during the surgery/procedure we request that   if you have artificial nails, gel coating, SNS etc. Please have those removed prior to your surgery/procedure. Not having the nail coverings /polish removed may result in cancellation or delay of your surgery/procedure.  Medication Instructions:  You can take all of your medications prior to your procedure.   Labs:  You will need lab work completed between 07/18/2021-07/24/2021  You must have a responsible person to drive you home and stay in the waiting area during your procedure. Failure to do so could result in cancellation.  Bring your insurance cards.  *Special Note: Every effort is made to have your procedure done on time. Occasionally there are emergencies that occur at the hospital that may cause delays. Please be patient if a delay does occur.

## 2021-07-17 NOTE — H&P (View-Only) (Signed)
Cardiology Office Note:    Date:  07/17/2021   ID:  Tammy Stuart, DOB 1971-09-14, MRN [id number]  PCP:  Nonnie Done., MD  Cardiologist:  Gypsy Balsam, MD    Referring MD: Nonnie Done., MD   Chief Complaint  Patient presents with   Follow-up    History of Present Illness:    Tammy Stuart is a 50 y.o. female with past medical history significant for atypical chest pain, dyspnea on exertion, Raynaud's phenomenon.  She was sent to [place] for evaluation of dyspnea on exertion.  She did have echocardiogram done to look at left ventricle ejection fraction.  It did reveal secundum ASD.  She also got coronary CT which showed no significant coronary arteries however clearly secondary to ASD been noted.  Her baseline pulmonary pressure is normal.  There is no significant enlargement of the right ventricle and right atrium.  She comes today to talk about those findings.  She describes shortness of breath exertional chest this is happening for last 2 years gradually getting worse.  There is no swelling of lower extremities no proximal for some dyspnea.  She says she does quite well sitting and doing things at low level of exercise but with some effort she will get short of breath.  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   IBS (irritable bowel syndrome)    Insomnia 10/28/2017   Migraines    Mild intermittent asthma without complication 05/15/2018   Nasal obstruction 08/18/2018   Persistent migraine aura without cerebral infarction and without status migrainosus, not intractable 01/08/2020   Pneumonia due to COVID-19 virus 02/20/2021   Precordial pain 10/25/2017   Pulmonary nodules 05/15/2018   Raynaud's disease without gangrene 11/09/2019   Status post bilateral mastectomy 10/28/2017   Tinnitus of both ears  01/08/2020   Trigger ring finger of right hand 11/09/2019   Wheezing 05/15/2018    Past Surgical History:  Procedure Laterality Date   BREAST BIOPSY     BREAST LUMPECTOMY     BREAST RECONSTRUCTION WITH PLACEMENT OF TISSUE EXPANDER AND FLEX HD (ACELLULAR HYDRATED DERMIS)     CHOLECYSTECTOMY     KNEE SURGERY     MASTECTOMY Bilateral    NASAL SEPTUM SURGERY     NASAL TURBINATE REDUCTION     SHOULDER SURGERY Bilateral     Current Medications: Current Meds  Medication Sig   albuterol (VENTOLIN HFA) 108 (90 Base) MCG/ACT inhaler Inhale 2 puffs into the lungs every 6 (six) hours as needed for wheezing or shortness of breath.   BREO ELLIPTA 200-25 MCG/INH AEPB Inhale 1 puff into the lungs daily.   desloratadine (CLARINEX) 5 MG tablet Take 1 tablet by mouth daily.   montelukast (SINGULAIR) 10 MG tablet Take 1 tablet by mouth daily.     Allergies:   Hydrocodone and Oxycodone-acetaminophen   Social History   Socioeconomic History   Marital status: Married    Spouse name: Not on file   Number of children: Not on file   Years of education: Not on file   Highest education level: Not on file  Occupational History   Not on file  Tobacco Use   Smoking status: Never   Smokeless tobacco: Never  Vaping Use   Vaping Use: Never used  Substance and Sexual  Activity   Alcohol use: No   Drug use: No   Sexual activity: Not on file  Other Topics Concern   Not on file  Social History Narrative   Not on file   Social Determinants of Health   Financial Resource Strain: Not on file  Food Insecurity: Not on file  Transportation Needs: Not on file  Physical Activity: Not on file  Stress: Not on file  Social Connections: Not on file     Family History: The patient's family history includes Breast cancer in her maternal aunt, mother, and paternal grandmother; Cervical cancer in her maternal grandmother; Hypertension in her father; Liver cancer in her maternal grandfather; Lung cancer in her  maternal grandfather; Migraines in her father; Prostate cancer in her maternal uncle. ROS:   Please see the history of present illness.    All 14 point review of systems negative except as described per history of present illness  EKGs/Labs/Other Studies Reviewed:      Recent Labs: 07/03/2021: BUN 15; Creatinine, Ser 0.91; Potassium 4.1; Sodium 142  Recent Lipid Panel    Component Value Date/Time   CHOL 218 (H) 06/14/2021 1520   TRIG 76 06/14/2021 1520   HDL 89 06/14/2021 1520   CHOLHDL 2.4 06/14/2021 1520   LDLCALC 116 (H) 06/14/2021 1520    Physical Exam:    VS:  BP 130/80 (BP Location: Left Arm, Patient Position: Sitting)   Pulse 68   Ht 5\' 4"  (1.626 m)   Wt 127 lb (57.6 kg)   SpO2 100%   BMI 21.80 kg/m     Wt Readings from Last 3 Encounters:  07/17/21 127 lb (57.6 kg)  06/14/21 128 lb (58.1 kg)  05/17/21 129 lb (58.5 kg)     GEN:  Well nourished, well developed in no acute distress HEENT: Normal NECK: No JVD; No carotid bruits LYMPHATICS: No lymphadenopathy CARDIAC: RRR, no murmurs, no rubs, no gallops RESPIRATORY:  Clear to auscultation without rales, wheezing or rhonchi  ABDOMEN: Soft, non-tender, non-distended MUSCULOSKELETAL:  No edema; No deformity  SKIN: Warm and dry LOWER EXTREMITIES: no swelling NEUROLOGIC:  Alert and oriented x 3 PSYCHIATRIC:  Normal affect   ASSESSMENT:    1. Raynaud's disease without gangrene   2. 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 Atypical chest pain   4. 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  No pulmonary hypertension however that was done at rest, no mentioning of enlargement of the right ventricle right atrium both on CT as well as echocardiogram.  We will talk to our structural heart team for course of action for distant area there are multiple things that can be assessed.  She may have stress echocardiogram trying to see if there  is increasing pressure within pulmonary circulation with exercises of course would make me what is her symptomatology in my opinion the defect need to be closed.  Another approach could be to do cardiac catheterization looking shunt however that can be also assessed by doing MRI.  Also MRI will help 05/19/21 to find out to make sure that there is a good possibility to fix the defect with Amplatzer device.  We will talk to our structural team about course of action for situation like that. Atypical chest pain: No coronary artery disease Dyspnea on exertion most likely related to ASD.  I had a chance to talk to  Dr. Excell Seltzer after I dictated this note, we decided to proceed with transesophageal echocardiogram to better delineate the nature of ASD and then she will follow-up Dr. Excell Seltzer in consultation.  I called patient on the phone and explained to her the plan including transesophageal echocardiogram procedure including all risks benefits as well as alternatives.  She agreed to proceed   Medication Adjustments/Labs and Tests Ordered: Current medicines are reviewed at length with the patient today.  Concerns regarding medicines are outlined above.  No orders of the defined types were placed in this encounter.  Medication changes: No orders of the defined types were placed in this encounter.   Signed, Georgeanna Lea, MD, [facility] 07/17/2021 12:03 PM    [facility]

## 2021-07-17 NOTE — Progress Notes (Signed)
Cardiology Office Note:    Date:  07/17/2021   ID:  Tammy Stuart, DOB 1971-09-14, MRN [id number]  PCP:  Nonnie Done., MD  Cardiologist:  Gypsy Balsam, MD    Referring MD: Nonnie Done., MD   Chief Complaint  Patient presents with   Follow-up    History of Present Illness:    Tammy Stuart is a 50 y.o. female with past medical history significant for atypical chest pain, dyspnea on exertion, Raynaud's phenomenon.  She was sent to [place] for evaluation of dyspnea on exertion.  She did have echocardiogram done to look at left ventricle ejection fraction.  It did reveal secundum ASD.  She also got coronary CT which showed no significant coronary arteries however clearly secondary to ASD been noted.  Her baseline pulmonary pressure is normal.  There is no significant enlargement of the right ventricle and right atrium.  She comes today to talk about those findings.  She describes shortness of breath exertional chest this is happening for last 2 years gradually getting worse.  There is no swelling of lower extremities no proximal for some dyspnea.  She says she does quite well sitting and doing things at low level of exercise but with some effort she will get short of breath.  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   IBS (irritable bowel syndrome)    Insomnia 10/28/2017   Migraines    Mild intermittent asthma without complication 05/15/2018   Nasal obstruction 08/18/2018   Persistent migraine aura without cerebral infarction and without status migrainosus, not intractable 01/08/2020   Pneumonia due to COVID-19 virus 02/20/2021   Precordial pain 10/25/2017   Pulmonary nodules 05/15/2018   Raynaud's disease without gangrene 11/09/2019   Status post bilateral mastectomy 10/28/2017   Tinnitus of both ears  01/08/2020   Trigger ring finger of right hand 11/09/2019   Wheezing 05/15/2018    Past Surgical History:  Procedure Laterality Date   BREAST BIOPSY     BREAST LUMPECTOMY     BREAST RECONSTRUCTION WITH PLACEMENT OF TISSUE EXPANDER AND FLEX HD (ACELLULAR HYDRATED DERMIS)     CHOLECYSTECTOMY     KNEE SURGERY     MASTECTOMY Bilateral    NASAL SEPTUM SURGERY     NASAL TURBINATE REDUCTION     SHOULDER SURGERY Bilateral     Current Medications: Current Meds  Medication Sig   albuterol (VENTOLIN HFA) 108 (90 Base) MCG/ACT inhaler Inhale 2 puffs into the lungs every 6 (six) hours as needed for wheezing or shortness of breath.   BREO ELLIPTA 200-25 MCG/INH AEPB Inhale 1 puff into the lungs daily.   desloratadine (CLARINEX) 5 MG tablet Take 1 tablet by mouth daily.   montelukast (SINGULAIR) 10 MG tablet Take 1 tablet by mouth daily.     Allergies:   Hydrocodone and Oxycodone-acetaminophen   Social History   Socioeconomic History   Marital status: Married    Spouse name: Not on file   Number of children: Not on file   Years of education: Not on file   Highest education level: Not on file  Occupational History   Not on file  Tobacco Use   Smoking status: Never   Smokeless tobacco: Never  Vaping Use   Vaping Use: Never used  Substance and Sexual  Activity   Alcohol use: No   Drug use: No   Sexual activity: Not on file  Other Topics Concern   Not on file  Social History Narrative   Not on file   Social Determinants of Health   Financial Resource Strain: Not on file  Food Insecurity: Not on file  Transportation Needs: Not on file  Physical Activity: Not on file  Stress: Not on file  Social Connections: Not on file     Family History: The patient's family history includes Breast cancer in her maternal aunt, mother, and paternal grandmother; Cervical cancer in her maternal grandmother; Hypertension in her father; Liver cancer in her maternal grandfather; Lung cancer in her  maternal grandfather; Migraines in her father; Prostate cancer in her maternal uncle. ROS:   Please see the history of present illness.    All 14 point review of systems negative except as described per history of present illness  EKGs/Labs/Other Studies Reviewed:      Recent Labs: 07/03/2021: BUN 15; Creatinine, Ser 0.91; Potassium 4.1; Sodium 142  Recent Lipid Panel    Component Value Date/Time   CHOL 218 (H) 06/14/2021 1520   TRIG 76 06/14/2021 1520   HDL 89 06/14/2021 1520   CHOLHDL 2.4 06/14/2021 1520   LDLCALC 116 (H) 06/14/2021 1520    Physical Exam:    VS:  BP 130/80 (BP Location: Left Arm, Patient Position: Sitting)   Pulse 68   Ht 5\' 4"  (1.626 m)   Wt 127 lb (57.6 kg)   SpO2 100%   BMI 21.80 kg/m     Wt Readings from Last 3 Encounters:  07/17/21 127 lb (57.6 kg)  06/14/21 128 lb (58.1 kg)  05/17/21 129 lb (58.5 kg)     GEN:  Well nourished, well developed in no acute distress HEENT: Normal NECK: No JVD; No carotid bruits LYMPHATICS: No lymphadenopathy CARDIAC: RRR, no murmurs, no rubs, no gallops RESPIRATORY:  Clear to auscultation without rales, wheezing or rhonchi  ABDOMEN: Soft, non-tender, non-distended MUSCULOSKELETAL:  No edema; No deformity  SKIN: Warm and dry LOWER EXTREMITIES: no swelling NEUROLOGIC:  Alert and oriented x 3 PSYCHIATRIC:  Normal affect   ASSESSMENT:    1. Raynaud's disease without gangrene   2. 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 Atypical chest pain   4. 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  No pulmonary hypertension however that was done at rest, no mentioning of enlargement of the right ventricle right atrium both on CT as well as echocardiogram.  We will talk to our structural heart team for course of action for distant area there are multiple things that can be assessed.  She may have stress echocardiogram trying to see if there  is increasing pressure within pulmonary circulation with exercises of course would make me what is her symptomatology in my opinion the defect need to be closed.  Another approach could be to do cardiac catheterization looking shunt however that can be also assessed by doing MRI.  Also MRI will help 05/19/21 to find out to make sure that there is a good possibility to fix the defect with Amplatzer device.  We will talk to our structural team about course of action for situation like that. Atypical chest pain: No coronary artery disease Dyspnea on exertion most likely related to ASD.  I had a chance to talk to  Dr. Excell Seltzer after I dictated this note, we decided to proceed with transesophageal echocardiogram to better delineate the nature of ASD and then she will follow-up Dr. Excell Seltzer in consultation.  I called patient on the phone and explained to her the plan including transesophageal echocardiogram procedure including all risks benefits as well as alternatives.  She agreed to proceed   Medication Adjustments/Labs and Tests Ordered: Current medicines are reviewed at length with the patient today.  Concerns regarding medicines are outlined above.  No orders of the defined types were placed in this encounter.  Medication changes: No orders of the defined types were placed in this encounter.   Signed, Georgeanna Lea, MD, [facility] 07/17/2021 12:03 PM    [facility]

## 2021-07-17 NOTE — Patient Instructions (Signed)
Medication Instructions:  Your physician recommends that you continue on your current medications as directed. Please refer to the Current Medication list given to you today.  *If you need a refill on your cardiac medications before your next appointment, please call your pharmacy*   Lab Work: None If you have labs (blood work) drawn today and your tests are completely normal, you will receive your results only by: MyChart Message (if you have MyChart) OR A paper copy in the mail If you have any lab test that is abnormal or we need to change your treatment, we will call you to review the results.   Testing/Procedures: None   Follow-Up: At [facility], you and your health needs are our priority.  As part of our continuing mission to provide you with exceptional heart care, we have created designated Provider Care Teams.  These Care Teams include your primary Cardiologist (physician) and Advanced Practice Providers (APPs -  Physician Assistants and Nurse Practitioners) who all work together to provide you with the care you need, when you need it.  We recommend signing up for the patient portal called "MyChart".  Sign up information is provided on this After Visit Summary.  MyChart is used to connect with patients for Virtual Visits (Telemedicine).  Patients are able to view lab/test results, encounter notes, upcoming appointments, etc.  Non-urgent messages can be sent to your provider as well.   To learn more about what you can do with MyChart, go to [email or web address].    Your next appointment:   3 month(s)  The format for your next appointment:   In Person  Provider:   Dr. Bing Matter    Other Instructions

## 2021-07-17 NOTE — Telephone Encounter (Signed)
-----   Message from Georgeanna Lea, MD sent at 07/17/2021  1:13 PM EDT ----- Please schedule her to have TEE done at [facility] and appointement with dr Excell Seltzer for ASD

## 2021-07-24 NOTE — Addendum Note (Signed)
Addended by: Belva Crome R on: 07/24/2021 03:43 PM   Modules accepted: Orders

## 2021-07-24 NOTE — Addendum Note (Signed)
Addended by: Belva Crome R on: 07/24/2021 03:42 PM   Modules accepted: Orders, SmartSet

## 2021-07-25 ENCOUNTER — Ambulatory Visit (HOSPITAL_COMMUNITY): Payer: BC Managed Care – PPO | Admitting: Anesthesiology

## 2021-07-25 ENCOUNTER — Encounter (HOSPITAL_COMMUNITY)
Admission: RE | Disposition: A | Discharge status: Home / Self Care | Payer: Self-pay | Source: Home / Self Care | Attending: Cardiovascular Disease

## 2021-07-25 ENCOUNTER — Other Ambulatory Visit: Payer: Self-pay

## 2021-07-25 ENCOUNTER — Ambulatory Visit (HOSPITAL_COMMUNITY)
Admission: RE | Admit: 2021-07-25 | Discharge: 2021-07-25 | Disposition: A | Discharge status: Home / Self Care | Payer: BC Managed Care – PPO | Attending: Cardiovascular Disease | Admitting: Cardiovascular Disease

## 2021-07-25 ENCOUNTER — Ambulatory Visit (HOSPITAL_BASED_OUTPATIENT_CLINIC_OR_DEPARTMENT_OTHER)
Admission: RE | Admit: 2021-07-25 | Discharge: 2021-07-25 | Disposition: A | Discharge status: Home / Self Care | Payer: BC Managed Care – PPO | Source: Ambulatory Visit | Attending: Cardiology | Admitting: Cardiology

## 2021-07-25 ENCOUNTER — Encounter (HOSPITAL_COMMUNITY): Payer: Self-pay | Admitting: Cardiovascular Disease

## 2021-07-25 DIAGNOSIS — Q211 Atrial septal defect: Secondary | ICD-10-CM

## 2021-07-25 DIAGNOSIS — I071 Rheumatic tricuspid insufficiency: Secondary | ICD-10-CM | POA: Insufficient documentation

## 2021-07-25 DIAGNOSIS — R0609 Other forms of dyspnea: Secondary | ICD-10-CM | POA: Insufficient documentation

## 2021-07-25 DIAGNOSIS — Z7951 Long term (current) use of inhaled steroids: Secondary | ICD-10-CM | POA: Diagnosis not present

## 2021-07-25 DIAGNOSIS — Z9013 Acquired absence of bilateral breasts and nipples: Secondary | ICD-10-CM | POA: Insufficient documentation

## 2021-07-25 DIAGNOSIS — Z9049 Acquired absence of other specified parts of digestive tract: Secondary | ICD-10-CM | POA: Diagnosis not present

## 2021-07-25 DIAGNOSIS — Z8616 Personal history of COVID-19: Secondary | ICD-10-CM | POA: Diagnosis not present

## 2021-07-25 DIAGNOSIS — Z79899 Other long term (current) drug therapy: Secondary | ICD-10-CM | POA: Insufficient documentation

## 2021-07-25 DIAGNOSIS — R0600 Dyspnea, unspecified: Secondary | ICD-10-CM | POA: Insufficient documentation

## 2021-07-25 DIAGNOSIS — Z885 Allergy status to narcotic agent status: Secondary | ICD-10-CM | POA: Insufficient documentation

## 2021-07-25 DIAGNOSIS — R079 Chest pain, unspecified: Secondary | ICD-10-CM | POA: Insufficient documentation

## 2021-07-25 DIAGNOSIS — I7300 Raynaud's syndrome without gangrene: Secondary | ICD-10-CM | POA: Diagnosis not present

## 2021-07-25 DIAGNOSIS — R0789 Other chest pain: Secondary | ICD-10-CM | POA: Diagnosis not present

## 2021-07-25 DIAGNOSIS — Z8249 Family history of ischemic heart disease and other diseases of the circulatory system: Secondary | ICD-10-CM | POA: Diagnosis not present

## 2021-07-25 DIAGNOSIS — Q2111 Secundum atrial septal defect: Secondary | ICD-10-CM

## 2021-07-25 HISTORY — PX: TEE WITHOUT CARDIOVERSION: SHX5443

## 2021-07-25 SURGERY — ECHOCARDIOGRAM, TRANSESOPHAGEAL
Anesthesia: Monitor Anesthesia Care

## 2021-07-25 MED ORDER — PROPOFOL 500 MG/50ML IV EMUL
INTRAVENOUS | Status: DC | PRN
  Administered 2021-07-25: 125 ug/kg/min via INTRAVENOUS

## 2021-07-25 MED ORDER — LIDOCAINE 2% (20 MG/ML) 5 ML SYRINGE
INTRAMUSCULAR | Status: DC | PRN
Start: 2021-07-25 — End: 2021-07-25
  Administered 2021-07-25: 60 mg via INTRAVENOUS

## 2021-07-25 MED ORDER — SODIUM CHLORIDE 0.9 % IV SOLN: INTRAVENOUS | Status: DC

## 2021-07-25 MED ORDER — PROPOFOL 10 MG/ML IV BOLUS
INTRAVENOUS | Status: DC | PRN
  Administered 2021-07-25 (×7): 20 mg via INTRAVENOUS

## 2021-07-25 NOTE — Transfer of Care (Signed)
Immediate Anesthesia Transfer of Care Note  Patient: Tammy Stuart  Procedure(s) Performed: TRANSESOPHAGEAL ECHOCARDIOGRAM (TEE)  Patient Location: PACU and Endoscopy Unit  Anesthesia Type:MAC  Level of Consciousness: drowsy, patient cooperative and responds to stimulation  Airway & Oxygen Therapy: Patient Spontanous Breathing  Post-op Assessment: Report given to RN and Post -op Vital signs reviewed and stable  Post vital signs: Reviewed and stable  Last Vitals:  Vitals Value Taken Time  BP    Temp    Pulse    Resp    SpO2      Last Pain:  Vitals:   07/25/21 1220  TempSrc: Oral  PainSc: 0-No pain         Complications: No notable events documented.

## 2021-07-25 NOTE — Anesthesia Postprocedure Evaluation (Signed)
Anesthesia Post Note  Patient: Tammy Stuart  Procedure(s) Performed: TRANSESOPHAGEAL ECHOCARDIOGRAM (TEE)     Patient location during evaluation: Endoscopy Anesthesia Type: MAC Level of consciousness: awake and alert Pain management: pain level controlled Vital Signs Assessment: post-procedure vital signs reviewed and stable Respiratory status: spontaneous breathing, nonlabored ventilation, respiratory function stable and patient connected to nasal cannula oxygen Cardiovascular status: stable and blood pressure returned to baseline Postop Assessment: no apparent nausea or vomiting Anesthetic complications: no   No notable events documented.  Last Vitals:  Vitals:   07/25/21 1415 07/25/21 1423  BP: (!) 133/92 (!) 135/108  Pulse: 69 72  Resp: 15 15  Temp:    SpO2: 99% 100%    Last Pain:  Vitals:   07/25/21 1423  TempSrc:   PainSc: 0-No pain                 Timothey Dahlstrom

## 2021-07-25 NOTE — Interval H&P Note (Signed)
History and Physical Interval Note:  07/25/2021 1:19 PM  Tammy Stuart  has presented today for surgery, with the diagnosis of ATRIAL SEPTAL DEFECT.  The various methods of treatment have been discussed with the patient and family. After consideration of risks, benefits and other options for treatment, the patient has consented to  Procedure(s): TRANSESOPHAGEAL ECHOCARDIOGRAM (TEE) (N/A) as a surgical intervention.  The patient's history has been reviewed, patient examined, no change in status, stable for surgery.  I have reviewed the patient's chart and labs.  Questions were answered to the patient's satisfaction.     Kristeen Miss

## 2021-07-25 NOTE — Telephone Encounter (Signed)
Patient is requesting to speak with Dr. Vanetta Shawl nurse regarding her TEE with Dr. Elease Hashimoto.

## 2021-07-25 NOTE — CV Procedure (Signed)
    Transesophageal Echocardiogram Note  BERNIECE ABID [id number] 30-Nov-1971  Procedure: Transesophageal Echocardiogram Indications: ASD  Procedure Details Consent: Obtained Time Out: Verified patient identification, verified procedure, site/side was marked, verified correct patient position, special equipment/implants available, Radiology Safety Procedures followed,  medications/allergies/relevent history reviewed, required imaging and test results available.  Performed  Medications:  During this procedure the patient Lidocaine 60 mg IV followed by propofol drip 270 mg IV .   The patient's heart rate, blood pressure, and oxygen saturation are monitored continuously during the procedure. The period of conscious sedation is 30  minutes, of which I was present face-to-face 100% of this time.  Left Ventrical:  normal LV function   Mitral Valve: normal LV , trace MR  Aortic Valve: normal   Tricuspid Valve: normal   Pulmonic Valve: normal   Left Atrium/ Left atrial appendage: no thrombi   Atrial septum: 3D images were obtained. small ASD with left to right shunting .   Aorta: normal    Complications: No apparent complications Patient did tolerate procedure well.   Vesta Mixer, Montez Hageman., MD, [facility] 07/25/2021, 2:04 PM

## 2021-07-25 NOTE — Discharge Instructions (Signed)

## 2021-07-25 NOTE — Interval H&P Note (Signed)
History and Physical Interval Note:  07/25/2021 1:17 PM  Tammy Stuart  has presented today for surgery, with the diagnosis of ATRIAL SEPTAL DEFECT.  The various methods of treatment have been discussed with the patient and family. After consideration of risks, benefits and other options for treatment, the patient has consented to  Procedure(s): TRANSESOPHAGEAL ECHOCARDIOGRAM (TEE) (N/A) as a surgical intervention.  The patient's history has been reviewed, patient examined, no change in status, stable for surgery.  I have reviewed the patient's chart and labs.  Questions were answered to the patient's satisfaction.     Kristeen Miss

## 2021-07-25 NOTE — Telephone Encounter (Signed)
Spoke to patient she states Dr. Melburn Popper came in the room I do not think you have an ASD. After the TEE he said well there is a ASD but it is so small." He states the TEE is just to show there is an ASD, not to show shunting." She states she does not want to ever see Dr. Melburn Popper again. She states "Dr. Melburn Popper said the test will not look at the valves, aorta, etc. It was just to show that the ASD is there." She states she get horse when she gets upset and the blood gets rushing, she forgot to tell Dr. Bing Matter there. She just wanted to let Dr. Bing Matter know what went on today.

## 2021-07-25 NOTE — Anesthesia Preprocedure Evaluation (Signed)
Anesthesia Evaluation  Patient identified by MRN, date of birth, ID band Patient awake    Reviewed: Allergy & Precautions, NPO status , Patient's Chart, lab work & pertinent test results  History of Anesthesia Complications Negative for: history of anesthetic complications  Airway Mallampati: I  TM Distance: >3 FB Neck ROM: Full    Dental  (+) Dental Advisory Given, Teeth Intact   Pulmonary shortness of breath, asthma , neg sleep apnea, neg COPD, neg recent URI,    breath sounds clear to auscultation       Cardiovascular  Rhythm:Regular  ASD   Neuro/Psych  Headaches,  Neuromuscular disease    GI/Hepatic negative GI ROS, Neg liver ROS,   Endo/Other  negative endocrine ROS  Renal/GU negative Renal ROS     Musculoskeletal negative musculoskeletal ROS (+)   Abdominal   Peds  Hematology negative hematology ROS (+)   Anesthesia Other Findings   Reproductive/Obstetrics                             Anesthesia Physical Anesthesia Plan  ASA: 2  Anesthesia Plan: MAC   Post-op Pain Management:    Induction: Intravenous  PONV Risk Score and Plan: 2 and Propofol infusion and Treatment may vary due to age or medical condition  Airway Management Planned: Nasal Cannula  Additional Equipment: None  Intra-op Plan:   Post-operative Plan:   Informed Consent: I have reviewed the patients History and Physical, chart, labs and discussed the procedure including the risks, benefits and alternatives for the proposed anesthesia with the patient or authorized representative who has indicated his/her understanding and acceptance.     Dental advisory given  Plan Discussed with: CRNA and Anesthesiologist  Anesthesia Plan Comments:         Anesthesia Quick Evaluation

## 2021-07-25 NOTE — Progress Notes (Signed)
  Echocardiogram Echocardiogram Transesophageal has been performed.  Sharonica Kraszewski G Jerra Huckeby 07/25/2021, 2:40 PM

## 2021-07-26 ENCOUNTER — Encounter (HOSPITAL_COMMUNITY): Payer: Self-pay | Admitting: Cardiovascular Disease

## 2021-07-26 ENCOUNTER — Telehealth: Payer: Self-pay

## 2021-07-26 NOTE — Progress Notes (Signed)
Left message

## 2021-07-26 NOTE — Telephone Encounter (Signed)
Per Dr. Bing Matter, scheduled the patient for ASD consult with Dr. Excell Seltzer 08/25/2021. The patient was grateful for call and agrees with plan.

## 2021-08-07 ENCOUNTER — Other Ambulatory Visit: Payer: Self-pay

## 2021-08-07 DIAGNOSIS — Q2111 Secundum atrial septal defect: Secondary | ICD-10-CM

## 2021-08-07 DIAGNOSIS — Q211 Atrial septal defect: Secondary | ICD-10-CM

## 2021-08-25 ENCOUNTER — Institutional Professional Consult (permissible substitution): Payer: BC Managed Care – PPO | Admitting: Cardiovascular Disease

## 2021-09-07 ENCOUNTER — Ambulatory Visit: Payer: BC Managed Care – PPO | Admitting: Cardiology

## 2021-09-07 DIAGNOSIS — Q211 Atrial septal defect, unspecified: Secondary | ICD-10-CM | POA: Insufficient documentation

## 2021-10-24 ENCOUNTER — Other Ambulatory Visit: Payer: Self-pay

## 2021-10-27 ENCOUNTER — Ambulatory Visit: Payer: BC Managed Care – PPO | Admitting: Cardiology

## 2022-01-12 ENCOUNTER — Ambulatory Visit: Payer: BC Managed Care – PPO | Admitting: Cardiology

## 2022-03-16 ENCOUNTER — Ambulatory Visit (INDEPENDENT_AMBULATORY_CARE_PROVIDER_SITE_OTHER): Payer: BC Managed Care – PPO

## 2022-03-16 ENCOUNTER — Other Ambulatory Visit: Payer: Self-pay

## 2022-03-16 ENCOUNTER — Ambulatory Visit (INDEPENDENT_AMBULATORY_CARE_PROVIDER_SITE_OTHER): Payer: BC Managed Care – PPO | Admitting: Podiatry

## 2022-03-16 DIAGNOSIS — M21612 Bunion of left foot: Secondary | ICD-10-CM | POA: Diagnosis not present

## 2022-03-16 DIAGNOSIS — Z01818 Encounter for other preprocedural examination: Secondary | ICD-10-CM

## 2022-03-16 DIAGNOSIS — M21611 Bunion of right foot: Secondary | ICD-10-CM | POA: Diagnosis not present

## 2022-03-16 DIAGNOSIS — M21619 Bunion of unspecified foot: Secondary | ICD-10-CM

## 2022-03-20 ENCOUNTER — Encounter: Payer: Self-pay | Admitting: Podiatry

## 2022-03-20 NOTE — Progress Notes (Signed)
?Subjective:  ?Patient ID: Tammy Stuart, female    DOB: 1971-10-30,  MRN: [id number] ? ?Chief Complaint  ?Patient presents with  ? Bunions  ?  Bilateral bunion right is worse   ? ? ?51 y.o. female presents with the above complaint.  Patient presents with complaint of right hallux abductor valgus/bunion deformity.  She states is painful to touch painful to walk on.  She has tried multiple conservative treatment options including shoe gear modification offloading protecting padding none of which has helped.  At this time she would like to discuss surgical options to correct the bunion.  Her pain scale 7 out of 10 hurts with ambulation hurts with pressure.  She would also like to have her pain medication management nonnarcotic pain. ? ?Review of Systems: Negative except as noted in the HPI. Denies N/V/F/Ch. ? ?Past Medical History:  ?Diagnosis Date  ? Allergic rhinitis   ? Asthma   ? Atypical chest pain 10/28/2017  ? Dyspnea on exertion 10/28/2017  ? 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 IBS (irritable bowel syndrome)   ? Insomnia 10/28/2017  ? Migraines   ? Mild intermittent asthma without complication 05/15/2018  ? Nasal obstruction 08/18/2018  ? Persistent migraine aura without cerebral infarction and without status migrainosus, not intractable 01/08/2020  ? Pneumonia due to COVID-19 virus 02/20/2021  ? Precordial pain 10/25/2017  ? Pulmonary nodules 05/15/2018  ? Raynaud's disease without gangrene 11/09/2019  ? 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 Trigger ring finger of right hand 11/09/2019  ? Wheezing 05/15/2018  ? ? ?Current Outpatient Medications:  ?  albuterol (VENTOLIN HFA) 108 (90 Base) MCG/ACT inhaler, Inhale 2 puffs into the lungs every 6 (six) hours as needed for wheezing or shortness of breath., Disp: , Rfl:  ?  BREO ELLIPTA 200-25 MCG/INH AEPB, Inhale 1 puff into the lungs daily., Disp: , Rfl:  ?  COLLAGEN PO, Take 2 capsules by mouth at bedtime., Disp: , Rfl:  ?   desloratadine (CLARINEX) 5 MG tablet, Take 5 mg by mouth at bedtime., Disp: , Rfl:  ?  Ibuprofen-Acetaminophen (ADVIL DUAL ACTION) 125-250 MG TABS, Take 2 mg by mouth at bedtime., Disp: , Rfl:  ?  magnesium gluconate (MAGONATE) 500 MG tablet, Take 1,000 mg by mouth at bedtime., Disp: , Rfl:  ?  montelukast (SINGULAIR) 10 MG tablet, Take 10 mg by mouth at bedtime., Disp: , Rfl:  ?  OVER THE COUNTER MEDICATION, Take 1 capsule by mouth at bedtime. amberen, Disp: , Rfl:  ?  OVER THE COUNTER MEDICATION, Take 3 capsules by mouth at bedtime. Cleanse more, Disp: , Rfl:  ?  Probiotic Product (ALIGN) 4 MG CAPS, Take 1 capsule by mouth at bedtime., Disp: , Rfl:  ?  saw palmetto 500 MG capsule, Take 500 mg by mouth at bedtime., Disp: , Rfl:  ?  Sodium Hyaluronate, oral, (HYALURONIC ACID) 100 MG CAPS, Take 100 mg by mouth at bedtime., Disp: , Rfl:  ? ?Social History  ? ?Tobacco Use  ?Smoking Status Never  ?Smokeless Tobacco Never  ? ? ?Allergies  ?Allergen Reactions  ? Hydrocodone Itching and Nausea Only  ? Oxycodone-Acetaminophen Itching and Nausea Only  ? ?Objective:  ?There were no vitals filed for this visit. ?There is no height or weight on file to calculate BMI. ?Constitutional Well developed. ?Well nourished.  ?Vascular Dorsalis pedis pulses palpable bilaterally. ?Posterior tibial pulses palpable bilaterally. ?Capillary refill normal to all digits.  ?No cyanosis or  clubbing noted. ?Pedal hair growth normal.  ?Neurologic Normal speech. ?Oriented to person, place, and time. ?Epicritic sensation to light touch grossly present bilaterally.  ?Dermatologic Nails well groomed and normal in appearance. ?No open wounds. ?No skin lesions.  ?Orthopedic: Normal joint ROM without pain or crepitus bilaterally. ?Hallux abductovalgus deformity present this is a track bound not a tracking deformity.  No intra-articular first MPJ pain noted.  Pain on palpation. ?Left 1st MPJ diminished range of motion. ?Left 1st TMT without gross  hypermobility. ?Right 1st MPJ diminished range of motion  ?Right 1st TMT without gross hypermobility. ?Lesser digital contractures present bilaterally.  ? ?Radiographs: ?Taken and reviewed. Hallux abductovalgus deformity present. Metatarsal parabola normal. 1st/2nd IMA: Moderate 12 to 15 degrees; TSP: 5 out of 7 ? ?Assessment:  ? ?1. Bunion   ?2. Encounter for preoperative examination for general surgical procedure   ? ?Plan:  ?Patient was evaluated and treated and all questions answered. ? ?Hallux abductovalgus deformity, right greater than left ?-XR as above. ?-Patient has failed all conservative therapy and wishes to proceed with surgical intervention. All risks, benefits, and alternatives discussed with patient. No guarantees given. Consent reviewed and signed by patient. Post-op course explained at length. ?-Planned procedures: Chevron osteotomy with phalangeal osteotomy possible ?-Risk factors: None ?-I discussed my preoperative intraoperative postoperative plan in extensive detail.  I discussed with the patient all the complications that are associated with it.  She would like to proceed with bunion correction. ?-Informed surgical risk consent was reviewed and read aloud to the patient.  I reviewed the films.  I have discussed my findings with the patient in great detail.  I have discussed all risks including but not limited to infection, stiffness, scarring, limp, disability, deformity, damage to blood vessels and nerves, numbness, poor healing, need for braces, arthritis, chronic pain, amputation, death.  All benefits and realistic expectations discussed in great detail.  I have made no promises as to the outcome.  I have provided realistic expectations.  I have offered the patient a 2nd opinion, which they have declined and assured me they preferred to proceed despite the risks ?-Cam boot was dispensed ?-After surgery patient will need a nonnarcotic pain medication ? ?No follow-ups on file. ? ?

## 2022-04-20 ENCOUNTER — Encounter: Payer: Self-pay | Admitting: Cardiology

## 2022-04-20 ENCOUNTER — Ambulatory Visit (INDEPENDENT_AMBULATORY_CARE_PROVIDER_SITE_OTHER): Payer: BC Managed Care – PPO | Admitting: Cardiology

## 2022-04-20 ENCOUNTER — Ambulatory Visit (INDEPENDENT_AMBULATORY_CARE_PROVIDER_SITE_OTHER): Payer: BC Managed Care – PPO

## 2022-04-20 VITALS — BP 126/80 | HR 70 | Ht 64.0 in | Wt 119.8 lb

## 2022-04-20 DIAGNOSIS — R0789 Other chest pain: Secondary | ICD-10-CM

## 2022-04-20 DIAGNOSIS — R5383 Other fatigue: Secondary | ICD-10-CM

## 2022-04-20 DIAGNOSIS — Q2111 Secundum atrial septal defect: Secondary | ICD-10-CM | POA: Diagnosis not present

## 2022-04-20 DIAGNOSIS — R0609 Other forms of dyspnea: Secondary | ICD-10-CM

## 2022-04-20 NOTE — Patient Instructions (Signed)
Medication Instructions:  ?Your physician recommends that you continue on your current medications as directed. Please refer to the Current Medication list given to you today.  ?*If you need a refill on your cardiac medications before your next appointment, please call your pharmacy* ? ? ?Lab Work: ?Vitamin B 12-Vitamin D3 today ?If you have labs (blood work) drawn today and your tests are completely normal, you will receive your results only by: ?MyChart Message (if you have MyChart) OR ?A paper copy in the mail ?If you have any lab test that is abnormal or we need to change your treatment, we will call you to review the results. ? ? ?Testing/Procedures: ?Your physician has requested that you have an echocardiogram. Echocardiography is a painless test that uses sound waves to create images of your heart. It provides your doctor with information about the size and shape of your heart and how well your heart?s chambers and valves are working. This procedure takes approximately one hour. There are no restrictions for this procedure.  ? ? ?Follow-Up: ?At [facility], you and your health needs are our priority.  As part of our continuing mission to provide you with exceptional heart care, we have created designated Provider Care Teams.  These Care Teams include your primary Cardiologist (physician) and Advanced Practice Providers (APPs -  Physician Assistants and Nurse Practitioners) who all work together to provide you with the care you need, when you need it. ? ?We recommend signing up for the patient portal called "MyChart".  Sign up information is provided on this After Visit Summary.  MyChart is used to connect with patients for Virtual Visits (Telemedicine).  Patients are able to view lab/test results, encounter notes, upcoming appointments, etc.  Non-urgent messages can be sent to your provider as well.   ?To learn more about what you can do with MyChart, go to [email or web address].   ? ?Your next  appointment:   ?3 month(s) ? ?The format for your next appointment:   ?In Person ? ?Provider:   ?Gypsy Balsam, MD  ? ? ?Other Instructions ?NA  ?

## 2022-04-20 NOTE — Progress Notes (Signed)
?Cardiology Office Note:   ? ?Date:  04/20/2022  ? ?ID:  Tammy Stuart, DOB [birth year], MRN [id number] ? ?PCP:  Nonnie Done., MD  ?Cardiologist:  Gypsy Balsam, MD   ? ?Referring MD: Nonnie Done., MD  ? ?Chief Complaint  ?Patient presents with  ? Fatigue  ? ? ?History of Present Illness:   ? ?Tammy Stuart is a 51 y.o. female   with past medical history significant for atypical chest pain, dyspnea on exertion, Raynaud's phenomenon.  She was sent to [place] for evaluation of dyspnea on exertion.  She did have echocardiogram done to look at left ventricle ejection fraction.  It did reveal secundum ASD.  She also got coronary CT which showed no significant coronary arteries however clearly secondary to ASD been noted.  Her baseline pulmonary pressure is normal.  There is no significant enlargement of the right ventricle and right atrium.  ?After that she was referred to [facility] where she have cardiac catheterization performed report of this is below.  It did not show hemodynamically significant atrial septal defect.  Atrial septal defect has not been closed. ?She is on my office today for follow-up she complain of being weak tired exhausted denies have any chest pain tightness squeezing pressure mid chest no palpitations dizziness just exertion is a problem ? ?Past Medical History:  ?Diagnosis Date  ? Allergic rhinitis   ? Asthma   ? Atypical chest pain 10/28/2017  ? Dyspnea on exertion 10/28/2017  ? 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 IBS (irritable bowel syndrome)   ? Insomnia 10/28/2017  ? Migraines   ? Mild intermittent asthma without complication 05/15/2018  ? Nasal obstruction 08/18/2018  ? Persistent migraine aura without cerebral infarction and without status migrainosus, not intractable 01/08/2020  ? Pneumonia due to COVID-19 virus 02/20/2021  ? Precordial pain 10/25/2017  ? Pulmonary nodules 05/15/2018  ? Raynaud's disease without gangrene 11/09/2019  ? 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 Tinnitus  of both ears 01/08/2020  ? Trigger ring finger of right hand 11/09/2019  ? Wheezing 05/15/2018  ? ? ?Past Surgical History:  ?Procedure Laterality Date  ? BREAST BIOPSY    ? BREAST LUMPECTOMY    ? BREAST RECONSTRUCTION WITH PLACEMENT OF TISSUE EXPANDER AND FLEX HD (ACELLULAR HYDRATED DERMIS)    ? CHOLECYSTECTOMY    ? KNEE SURGERY    ? MASTECTOMY Bilateral   ? NASAL SEPTUM SURGERY    ? NASAL TURBINATE REDUCTION    ? SHOULDER SURGERY Bilateral   ? TEE WITHOUT CARDIOVERSION N/A 07/25/2021  ? Procedure: TRANSESOPHAGEAL ECHOCARDIOGRAM (TEE);  Surgeon: Elease Hashimoto Deloris Ping, MD;  Location: [facility] ENDOSCOPY;  Service: Cardiovascular;  Laterality: N/A;  ? ? ?Current Medications: ?Current Meds  ?Medication Sig  ? albuterol (VENTOLIN HFA) 108 (90 Base) MCG/ACT inhaler Inhale 2 puffs into the lungs every 6 (six) hours as needed for wheezing or shortness of breath.  ? BREO ELLIPTA 200-25 MCG/INH AEPB Inhale 1 puff into the lungs daily.  ? COLLAGEN PO Take 2 capsules by mouth at bedtime.  ? desloratadine (CLARINEX) 5 MG tablet Take 5 mg by mouth at bedtime.  ? Ibuprofen-Acetaminophen (ADVIL DUAL ACTION) 125-250 MG TABS Take 2 mg by mouth at bedtime.  ? magnesium gluconate (MAGONATE) 500 MG tablet Take 1,000 mg by mouth at bedtime.  ? montelukast (SINGULAIR) 10 MG tablet Take 10 mg by mouth at bedtime.  ? OVER THE COUNTER MEDICATION Take 1 capsule by mouth at bedtime. amberen  ? OVER THE  COUNTER MEDICATION Take 3 capsules by mouth at bedtime. Cleanse more  ? Probiotic Product (ALIGN) 4 MG CAPS Take 1 capsule by mouth at bedtime.  ? saw palmetto 500 MG capsule Take 500 mg by mouth at bedtime.  ? Sodium Hyaluronate, oral, (HYALURONIC ACID) 100 MG CAPS Take 100 mg by mouth at bedtime.  ?  ? ?Allergies:   Hydrocodone, Oxycodone-acetaminophen, and Oxycodone hcl  ? ?Social History  ? ?Socioeconomic History  ? Marital status: Married  ?  Spouse name: Not on file  ? Number of children: Not on file  ? Years of education: Not on file  ? Highest  education level: Not on file  ?Occupational History  ? Not on file  ?Tobacco Use  ? Smoking status: Never  ? Smokeless tobacco: Never  ?Vaping Use  ? Vaping Use: Never used  ?Substance and Sexual Activity  ? Alcohol use: No  ? Drug use: No  ? Sexual activity: Not on file  ?Other Topics Concern  ? Not on file  ?Social History Narrative  ? Not on file  ? ?Social Determinants of Health  ? ?Financial Resource Strain: Not on file  ?Food Insecurity: Not on file  ?Transportation Needs: Not on file  ?Physical Activity: Not on file  ?Stress: Not on file  ?Social Connections: Not on file  ?  ? ?Family History: ?The patient's family history includes Breast cancer in her maternal aunt, mother, and paternal grandmother; Cervical cancer in her maternal grandmother; Hypertension in her father; Liver cancer in her maternal grandfather; Lung cancer in her maternal grandfather; Migraines in her father; Prostate cancer in her maternal uncle. ?ROS:   ?Please see the history of present illness.    ?All 14 point review of systems negative except as described per history of present illness ? ?EKGs/Labs/Other Studies Reviewed:   ?Right-sided cardiac catheterization done on 10/09/2021 showed: ? ?Procedural Statistics   ? ?Pressures   ?RA 11 mmHg/9 mmHg/7 mmHg  ?RV 47 mmHg/1 mmHg/9 mmHg  ?PA 38 mmHg/12 mmHg/21 mmHg  ?PCW 14 mmHg/13 mmHg/11 mmHg  ?LA  / /   ?LV  / /   ?AO  / /   ?BP 137/80  ? ? ?Saturations   ?IVC Saturation O2-Sat 81  ?SVC Saturation O2-Sat 78  ?RA Saturation O2-Sat    ?RV Saturation O2-Sat    ?PA Saturation O2-Sat 79  ?PA Saturation O2-Cont 144.2  ?PCW Saturation O2-Sat    ?AO Saturation O2-Sat 94  ?AO Saturation O2-cont 172.1  ? ? ?Derived Parameters   ?PVR 132.7  ?PVR Index 210.3  ?SVR    ?SVR Index    ? ? ?Cardiac Output   ?Fick A-V O2 Diff 27.9 mL/L  ?Fick O2 Consumption    ?Fick Predicted O2 Consumption 168.5 mL/min  ?Fick CO 6.03 l/min  ?Fick CI 3.8 l/min/m2  ?Thermo CO    ?Thermo CI    ? ? ?Ventricular Data   ?LV Max  dp/dt    ?LV Max dp/dt/p    ?RV Max dp/dt 464 mmHg/s  ?RV Max dp/dt/p     ? ? ?Recent Labs: ?07/03/2021: BUN 15; Creatinine, Ser 0.91; Potassium 4.1; Sodium 142  ?Recent Lipid Panel ?   ?Component Value Date/Time  ? CHOL 218 (H) 06/14/2021 1520  ? TRIG 76 06/14/2021 1520  ? HDL 89 06/14/2021 1520  ? CHOLHDL 2.4 06/14/2021 1520  ? LDLCALC 116 (H) 06/14/2021 1520  ? ? ?Physical Exam:   ? ?VS:  BP 126/80 (BP Location: Left  Arm, Patient Position: Sitting)   Pulse 70   Ht 5\' 4"  (1.626 m)   Wt 119 lb 12.8 oz (54.3 kg)   SpO2 98%   BMI 20.56 kg/m?    ? ?Wt Readings from Last 3 Encounters:  ?04/20/22 119 lb 12.8 oz (54.3 kg)  ?07/25/21 125 lb (56.7 kg)  ?07/17/21 127 lb (57.6 kg)  ?  ? ?GEN:  Well nourished, well developed in no acute distress ?HEENT: Normal ?NECK: No JVD; No carotid bruits ?LYMPHATICS: No lymphadenopathy ?CARDIAC: RRR, no murmurs, no rubs, no gallops ?RESPIRATORY:  Clear to auscultation without rales, wheezing or rhonchi  ?ABDOMEN: Soft, non-tender, non-distended ?MUSCULOSKELETAL:  No edema; No deformity  ?SKIN: Warm and dry ?LOWER EXTREMITIES: no swelling ?NEUROLOGIC:  Alert and oriented x 3 ?PSYCHIATRIC:  Normal affect  ? ?ASSESSMENT:   ? ?1. Dyspnea on exertion   ?2. Fatigue, unspecified type   ?3. ASD secundum measuring 6.9 mm   ?4. Atypical chest pain   ? ?PLAN:   ? ?In order of problems listed above: ? ?Atrial septal defect hemodynamically insignificant based on cardiac catheterization performed at the end of last year.  Still complain of being extremely exhausted and fatigued and short of breath sometimes I will ask her to have echocardiogram done to reassess left ventricle ejection fraction and right ventricle size and function with pulm artery pressure even though her cardiac catheterization showed hemodynamically insignificant ASD.  No indication for closure. ?Atypical chest pain coronary arteries are clean. ?Fatigue again echocardiogram will be done I will check her vitamin B12 level as well  as vitamin D3 ?[facility] ? ? ?Medication Adjustments/Labs and Tests Ordered: ?Current medicines are reviewed at length with the patient today.  Concerns regarding medicine

## 2022-04-21 LAB — VITAMIN D 25 HYDROXY (VIT D DEFICIENCY, FRACTURES): Vit D, 25-Hydroxy: 28.8 ng/mL — ABNORMAL LOW (ref 30.0–100.0)

## 2022-04-21 LAB — VITAMIN B12: Vitamin B-12: 502 pg/mL (ref 232–1245)

## 2022-04-26 ENCOUNTER — Telehealth: Payer: Self-pay

## 2022-04-26 NOTE — Telephone Encounter (Signed)
-----   Message from Georgeanna Lea, MD sent at 04/25/2022  9:21 PM EDT ----- ?Vitamin B12 normal level right coming D3 low she needs to start taking 2000 units of vitamin D every single day ?

## 2022-04-26 NOTE — Telephone Encounter (Signed)
Patient notified of results and recommendations and agreed with plan.  

## 2022-05-07 ENCOUNTER — Ambulatory Visit (INDEPENDENT_AMBULATORY_CARE_PROVIDER_SITE_OTHER): Payer: BC Managed Care – PPO

## 2022-05-07 DIAGNOSIS — R0609 Other forms of dyspnea: Secondary | ICD-10-CM | POA: Diagnosis not present

## 2022-05-07 DIAGNOSIS — R5383 Other fatigue: Secondary | ICD-10-CM | POA: Diagnosis not present

## 2022-05-07 DIAGNOSIS — I361 Nonrheumatic tricuspid (valve) insufficiency: Secondary | ICD-10-CM

## 2022-05-07 LAB — ECHOCARDIOGRAM COMPLETE
Area-P 1/2: 3.79 cm2
S' Lateral: 2.5 cm

## 2022-05-19 DIAGNOSIS — R5383 Other fatigue: Secondary | ICD-10-CM

## 2022-05-19 DIAGNOSIS — R0609 Other forms of dyspnea: Secondary | ICD-10-CM | POA: Diagnosis not present

## 2022-05-22 ENCOUNTER — Telehealth: Payer: Self-pay | Admitting: *Deleted

## 2022-05-22 NOTE — Telephone Encounter (Signed)
Patient returned call stating she just put on monitor on the 27th of May.

## 2022-05-22 NOTE — Telephone Encounter (Signed)
Left message for pt to please mail back their zio heart monitor. The company is reporting no record of monitor being mailed.

## 2022-05-23 NOTE — Telephone Encounter (Signed)
Left message for pt thanking her for letting me know about the monitor and that I would be looking for her monitor results in the future.

## 2022-06-11 ENCOUNTER — Telehealth: Payer: Self-pay | Admitting: Urology

## 2022-06-11 NOTE — Telephone Encounter (Signed)
DOS - 07/09/22  [place] BUNIONECTOMY RIGHT --- 98264 DOUBLE OSTEOTOMY RIGHT --- 28299  [employer] EFFECTIVE DATE - 12/24/21  PLAN DEDUCTIBLE - $7,500.00 W/ $5,102.47 REMAINING OUT OF POCKET - $7,500.00 W/ $5,102.47 REMAINING COINSURANCE - 0% COPAY - $0.00   NO PRIOR AUTH REQUIRED

## 2022-06-22 ENCOUNTER — Telehealth: Payer: Self-pay

## 2022-06-22 NOTE — Telephone Encounter (Signed)
Results discussed with patient, appt r/s sooner.

## 2022-06-22 NOTE — Telephone Encounter (Signed)
-----   Message from Georgeanna Lea, MD sent at 06/20/2022 12:52 PM EDT ----- 1 episode of supraventricular tachycardia, will talk about potential treatment when she comes for follow-up

## 2022-07-02 ENCOUNTER — Encounter: Payer: Self-pay | Admitting: Cardiology

## 2022-07-02 ENCOUNTER — Ambulatory Visit (INDEPENDENT_AMBULATORY_CARE_PROVIDER_SITE_OTHER): Payer: BC Managed Care – PPO | Admitting: Cardiology

## 2022-07-02 VITALS — BP 108/74 | HR 75 | Ht 64.0 in | Wt 121.0 lb

## 2022-07-02 DIAGNOSIS — Q2111 Secundum atrial septal defect: Secondary | ICD-10-CM

## 2022-07-02 DIAGNOSIS — R0789 Other chest pain: Secondary | ICD-10-CM | POA: Diagnosis not present

## 2022-07-02 DIAGNOSIS — R0609 Other forms of dyspnea: Secondary | ICD-10-CM | POA: Diagnosis not present

## 2022-07-02 DIAGNOSIS — I7300 Raynaud's syndrome without gangrene: Secondary | ICD-10-CM | POA: Diagnosis not present

## 2022-07-02 DIAGNOSIS — I471 Supraventricular tachycardia: Secondary | ICD-10-CM | POA: Insufficient documentation

## 2022-07-02 NOTE — Patient Instructions (Signed)
Medication Instructions:  Your physician recommends that you continue on your current medications as directed. Please refer to the Current Medication list given to you today.  *If you need a refill on your cardiac medications before your next appointment, please call your pharmacy*   Lab Work: None Ordered If you have labs (blood work) drawn today and your tests are completely normal, you will receive your results only by: MyChart Message (if you have MyChart) OR A paper copy in the mail If you have any lab test that is abnormal or we need to change your treatment, we will call you to review the results.   Testing/Procedures: Your physician has requested that you have an echocardiogram. Echocardiography is a painless test that uses sound waves to create images of your heart. It provides your doctor with information about the size and shape of your heart and how well your heart's chambers and valves are working. This procedure takes approximately one hour. There are no restrictions for this procedure.    Follow-Up: At [facility], you and your health needs are our priority.  As part of our continuing mission to provide you with exceptional heart care, we have created designated Provider Care Teams.  These Care Teams include your primary Cardiologist (physician) and Advanced Practice Providers (APPs -  Physician Assistants and Nurse Practitioners) who all work together to provide you with the care you need, when you need it.  We recommend signing up for the patient portal called "MyChart".  Sign up information is provided on this After Visit Summary.  MyChart is used to connect with patients for Virtual Visits (Telemedicine).  Patients are able to view lab/test results, encounter notes, upcoming appointments, etc.  Non-urgent messages can be sent to your provider as well.   To learn more about what you can do with MyChart, go to [email or web address].    Your next appointment:   12  month(s)  The format for your next appointment:   In Person  Provider:   Robert Krasowski, MD    Other Instructions NA  

## 2022-07-02 NOTE — Progress Notes (Unsigned)
Cardiology Office Note:    Date:  07/02/2022   ID:  Tammy Stuart, DOB [birth year], MRN [id number]  PCP:  Enid Skeens., MD  Cardiologist:  Jenne Campus, MD    Referring MD: Enid Skeens., MD   Chief Complaint  Patient presents with   Results    Monitor    History of Present Illness:    Tammy Stuart is a 51 y.o. female with past medical history significant for ASD which was hemodynamically insignificant, no need to close ASD evaluated in details by [facility].  Also history of rainout, palpitations.  She comes today to discuss results of her monitor.  Monitor showed supraventricular tachycardia 15 seconds episode but completely asymptomatic.  She is doing very well.  She just returned from [place] when she injured herself 10 days invitations doing a lot of adventurous that she enjoyed.  Denies have any cardiac implants.  No chest pain tightness squeezing pressure mid chest no palpitations dizziness swelling of lower extremities  Past Medical History:  Diagnosis Date   Allergic rhinitis    Asthma    Atypical chest pain 10/28/2017   Dyspnea on exertion 10/28/2017   Hypersensitivity disorder 05/15/2018   IBS (irritable bowel syndrome)    Insomnia 10/28/2017   Migraines    Mild intermittent asthma without complication 0000000   Nasal obstruction 08/18/2018   Persistent migraine aura without cerebral infarction and without status migrainosus, not intractable 01/08/2020   Pneumonia due to COVID-19 virus 02/20/2021   Precordial pain 10/25/2017   Pulmonary nodules 05/15/2018   Raynaud's disease without gangrene 11/09/2019   Status post bilateral mastectomy 10/28/2017   Tinnitus of both ears 01/08/2020   Trigger ring finger of right hand 11/09/2019   Wheezing 05/15/2018    Past Surgical History:  Procedure Laterality Date   BREAST BIOPSY     BREAST LUMPECTOMY     BREAST RECONSTRUCTION WITH PLACEMENT OF TISSUE EXPANDER AND FLEX HD (ACELLULAR HYDRATED DERMIS)     CHOLECYSTECTOMY      KNEE SURGERY     MASTECTOMY Bilateral    NASAL SEPTUM SURGERY     NASAL TURBINATE REDUCTION     SHOULDER SURGERY Bilateral    TEE WITHOUT CARDIOVERSION N/A 07/25/2021   Procedure: TRANSESOPHAGEAL ECHOCARDIOGRAM (TEE);  Surgeon: Thayer Headings, MD;  Location: [facility] ENDOSCOPY;  Service: Cardiovascular;  Laterality: N/A;    Current Medications: Current Meds  Medication Sig   albuterol (VENTOLIN HFA) 108 (90 Base) MCG/ACT inhaler Inhale 2 puffs into the lungs every 6 (six) hours as needed for wheezing or shortness of breath.   BREO ELLIPTA 200-25 MCG/INH AEPB Inhale 1 puff into the lungs daily.   Cholecalciferol (VITAMIN D3) 50 MCG (2000 UT) TABS Take 1 tablet by mouth daily.   COLLAGEN PO Take 2 capsules by mouth at bedtime.   desloratadine (CLARINEX) 5 MG tablet Take 5 mg by mouth at bedtime.   Ibuprofen-Acetaminophen (ADVIL DUAL ACTION) 125-250 MG TABS Take 2 mg by mouth at bedtime.   magnesium gluconate (MAGONATE) 500 MG tablet Take 1,000 mg by mouth at bedtime.   montelukast (SINGULAIR) 10 MG tablet Take 10 mg by mouth at bedtime.   OVER THE COUNTER MEDICATION Take 1 capsule by mouth at bedtime. amberen   OVER THE COUNTER MEDICATION Take 3 capsules by mouth at bedtime. Cleanse more   Probiotic Product (ALIGN) 4 MG CAPS Take 1 capsule by mouth at bedtime.   saw palmetto 500 MG capsule Take 500 mg by mouth at  bedtime.   Sodium Hyaluronate, oral, (HYALURONIC ACID) 100 MG CAPS Take 100 mg by mouth at bedtime.     Allergies:   Hydrocodone, Oxycodone-acetaminophen, and Oxycodone hcl   Social History   Socioeconomic History   Marital status: Married    Spouse name: Not on file   Number of children: Not on file   Years of education: Not on file   Highest education level: Not on file  Occupational History   Not on file  Tobacco Use   Smoking status: Never   Smokeless tobacco: Never  Vaping Use   Vaping Use: Never used  Substance and Sexual Activity   Alcohol use: No   Drug use:  No   Sexual activity: Not on file  Other Topics Concern   Not on file  Social History Narrative   Not on file   Social Determinants of Health   Financial Resource Strain: Not on file  Food Insecurity: Not on file  Transportation Needs: Not on file  Physical Activity: Not on file  Stress: Not on file  Social Connections: Not on file     Family History: The patient's family history includes Breast cancer in her maternal aunt, mother, and paternal grandmother; Cervical cancer in her maternal grandmother; Hypertension in her father; Liver cancer in her maternal grandfather; Lung cancer in her maternal grandfather; Migraines in her father; Prostate cancer in her maternal uncle. ROS:   Please see the history of present illness.    All 14 point review of systems negative except as described per history of present illness  EKGs/Labs/Other Studies Reviewed:      Recent Labs: 07/03/2021: BUN 15; Creatinine, Ser 0.91; Potassium 4.1; Sodium 142  Recent Lipid Panel    Component Value Date/Time   CHOL 218 (H) 06/14/2021 1520   TRIG 76 06/14/2021 1520   HDL 89 06/14/2021 1520   CHOLHDL 2.4 06/14/2021 1520   LDLCALC 116 (H) 06/14/2021 1520    Physical Exam:    VS:  BP 108/74 (BP Location: Left Arm, Patient Position: Sitting)   Pulse 75   Ht 5\' 4"  (1.626 m)   Wt 121 lb (54.9 kg)   SpO2 96%   BMI 20.77 kg/m     Wt Readings from Last 3 Encounters:  07/02/22 121 lb (54.9 kg)  04/20/22 119 lb 12.8 oz (54.3 kg)  07/25/21 125 lb (56.7 kg)     GEN:  Well nourished, well developed in no acute distress HEENT: Normal NECK: No JVD; No carotid bruits LYMPHATICS: No lymphadenopathy CARDIAC: RRR, no murmurs, no rubs, no gallops RESPIRATORY:  Clear to auscultation without rales, wheezing or rhonchi  ABDOMEN: Soft, non-tender, non-distended MUSCULOSKELETAL:  No edema; No deformity  SKIN: Warm and dry LOWER EXTREMITIES: no swelling NEUROLOGIC:  Alert and oriented x 3 PSYCHIATRIC:   Normal affect   ASSESSMENT:    1. ASD secundum measuring 6.9 mm   2. Raynaud's disease without gangrene   3. Atypical chest pain   4. Dyspnea on exertion   5. Supraventricular tachycardia (HCC)    PLAN:    In order of problems listed above:  ASD secundum small insignificant hemodynamically next year we will repeat echocardiogram Renal phenomenon is stable Atypical chest pain denies having any Dyspnea on exertion denies having any Supraventricular tachycardia, asymptomatic.  No need to treat continue present management.  I told her if she becomes symptomatic she is to let me know Dyslipidemia I did review K PN which show me LDL of 116 HDL  89 good cholesterol profile continue present management.   Medication Adjustments/Labs and Tests Ordered: Current medicines are reviewed at length with the patient today.  Concerns regarding medicines are outlined above.  No orders of the defined types were placed in this encounter.  Medication changes: No orders of the defined types were placed in this encounter.   Signed, Georgeanna Lea, MD, [facility] 07/02/2022 10:07 AM    [facility]

## 2022-07-18 ENCOUNTER — Encounter: Payer: BC Managed Care – PPO | Admitting: Podiatry

## 2022-07-26 ENCOUNTER — Encounter: Payer: Self-pay | Admitting: Cardiology

## 2022-07-31 ENCOUNTER — Ambulatory Visit: Payer: BC Managed Care – PPO | Admitting: Cardiology

## 2022-08-01 ENCOUNTER — Encounter: Payer: BC Managed Care – PPO | Admitting: Podiatry

## 2023-01-02 ENCOUNTER — Ambulatory Visit: Payer: BC Managed Care – PPO | Attending: Cardiology

## 2023-01-02 DIAGNOSIS — R0609 Other forms of dyspnea: Secondary | ICD-10-CM

## 2023-01-02 LAB — ECHOCARDIOGRAM COMPLETE
Area-P 1/2: 3.68 cm2
S' Lateral: 2.8 cm

## 2023-01-11 ENCOUNTER — Telehealth: Payer: Self-pay

## 2023-01-11 NOTE — Telephone Encounter (Signed)
LVM and My Chart message per DPR- Per Dr. Krasowski's note. Encouraged pt call with any questions. Routed to PCP. 

## 2023-01-12 ENCOUNTER — Encounter: Payer: Self-pay | Admitting: Cardiology

## 2023-04-20 IMAGING — DX DG CHEST 2V
2 series · 2 of 2 positions shown · non-contrast
Comparison: None.

CLINICAL DATA: Shortness of breath for 2 days

EXAM:
CHEST - 2 VIEW

[chest pa]
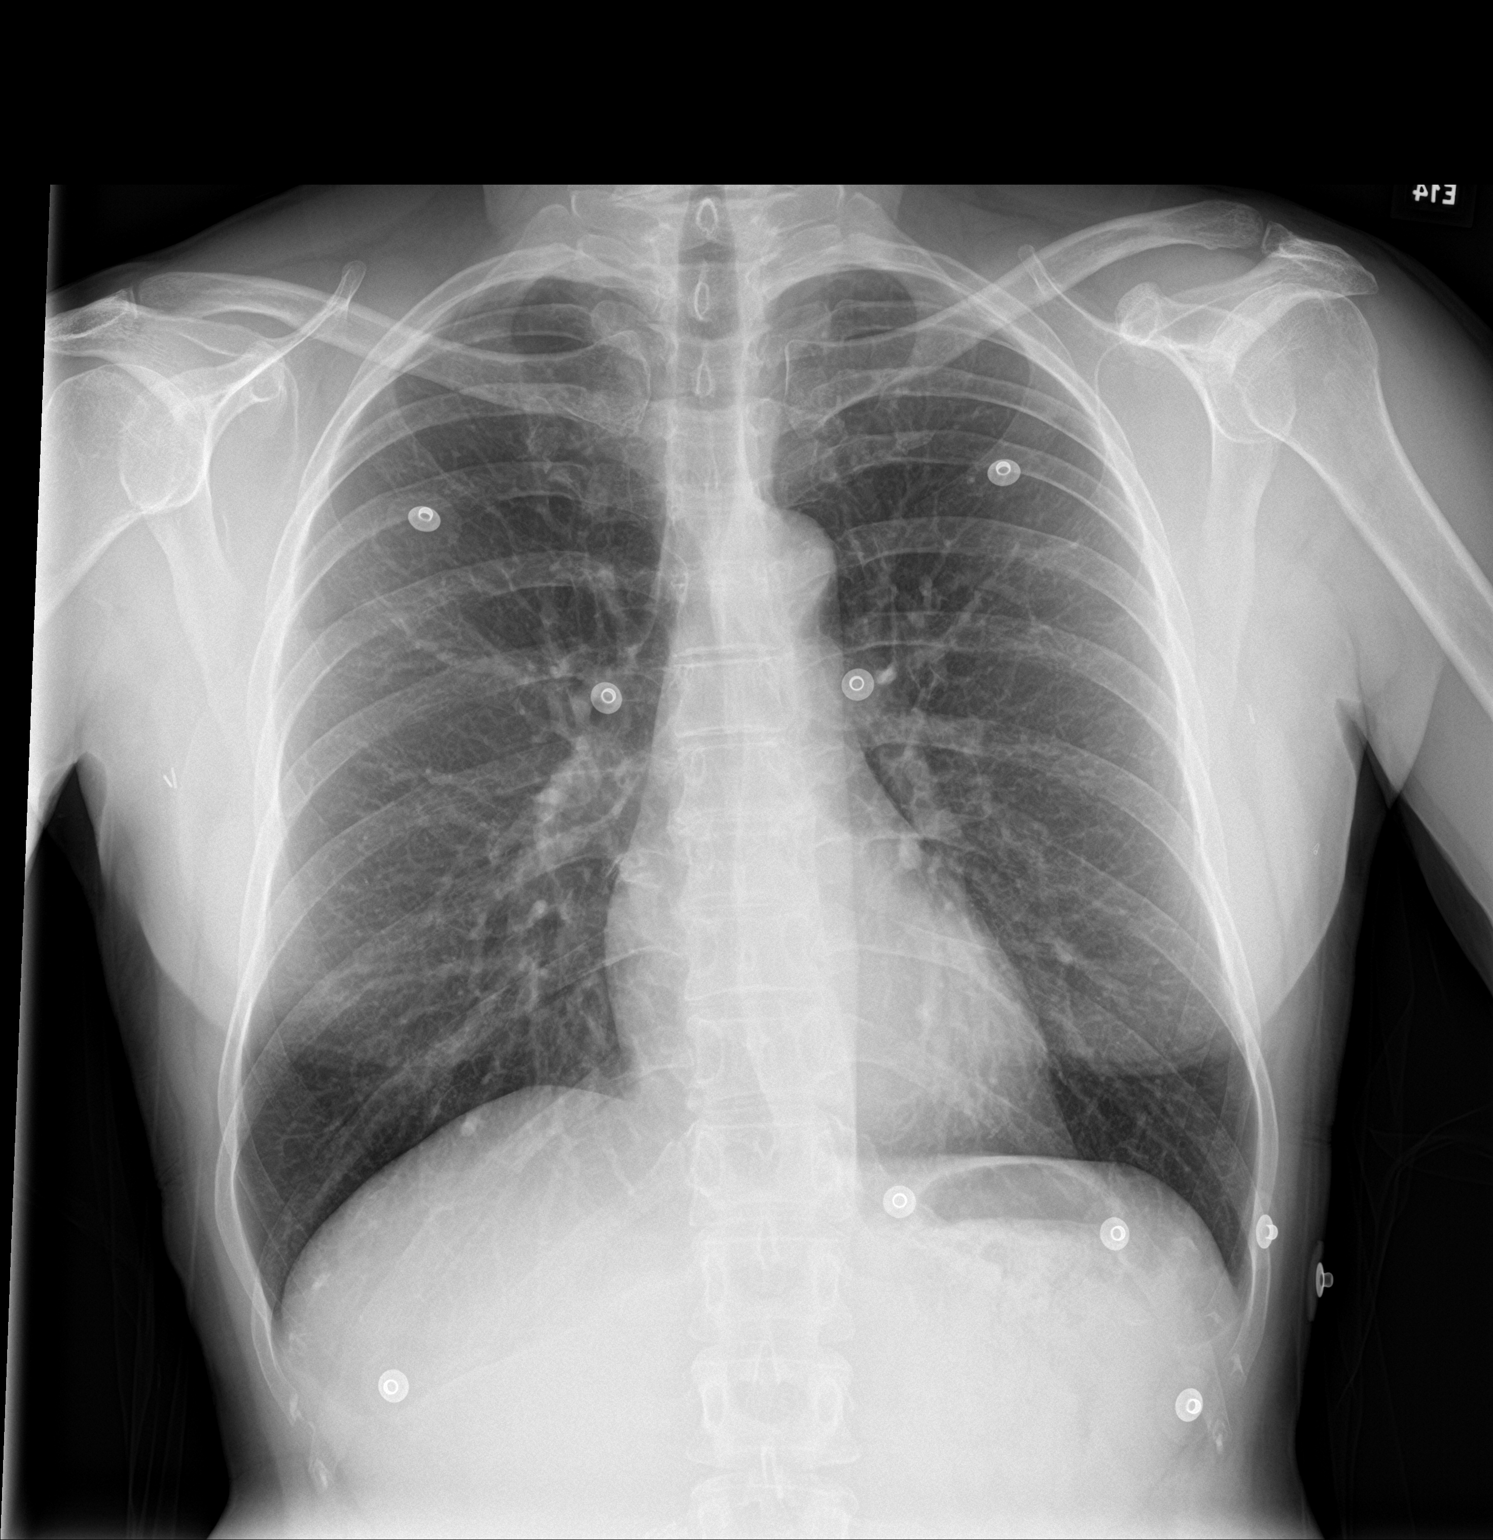

[chest lat]
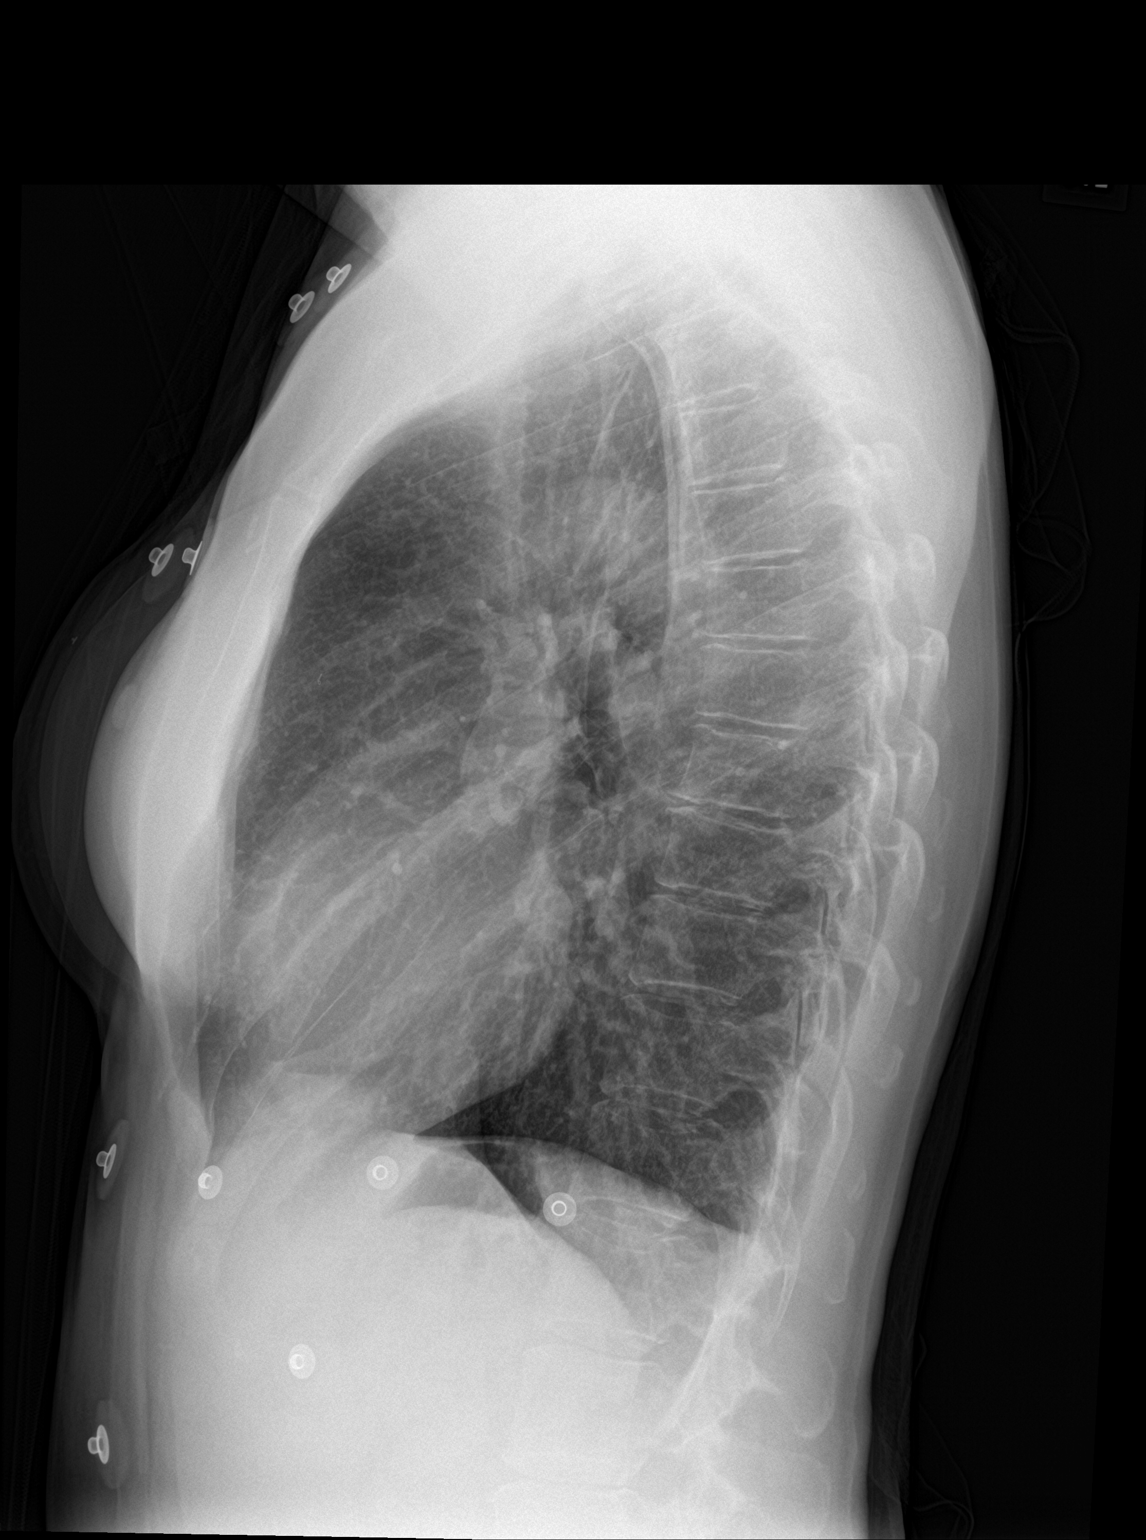

[2 of 2 positions shown; findings below may reference images not displayed]

FINDINGS: The heart size and mediastinal contours are within normal limits.
Both lungs are clear. The visualized skeletal structures are
unremarkable.
IMPRESSION: No active cardiopulmonary disease.

## 2023-06-09 IMAGING — CT CT HEART MORP W/ CTA COR W/ SCORE W/ CA W/CM &/OR W/O CM
4 of 7 series · 8 of 20 positions shown, 9 images · non-contrast
Comparison: 04/24/2021
COMPARISON: 04/24/2021

Addendum:
EXAM:
OVER-READ INTERPRETATION  CT CHEST

The following report is an over-read performed by radiologist Dr.
Magtymguly Tuna [[email or web address]] on 07/04/2021. This over-read
does not include interpretation of cardiac or coronary anatomy or
pathology. The coronary CTA interpretation by the cardiologist is
attached.
CLINICAL DATA: This is a 49 year old female with chest pain.
Cardiac/Coronary  CTA
TECHNIQUE: The patient was scanned on a [employer] scanner.

[Series 6: best diast 71 % · axial · 0.39mm/px · z∈[-152,-111]mm · 2 of 306 slices shown]
[im 102/306  vessel]
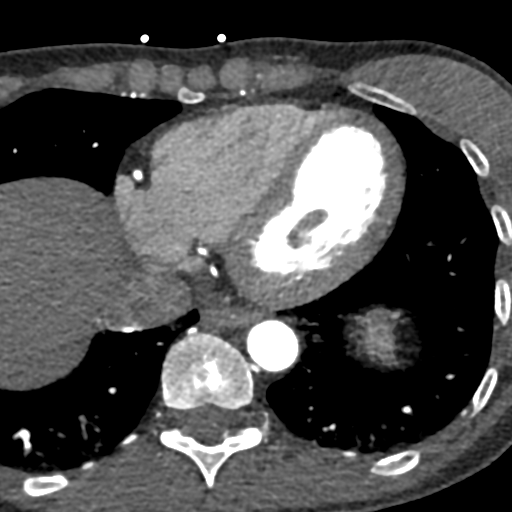
[im 204/306  vessel]
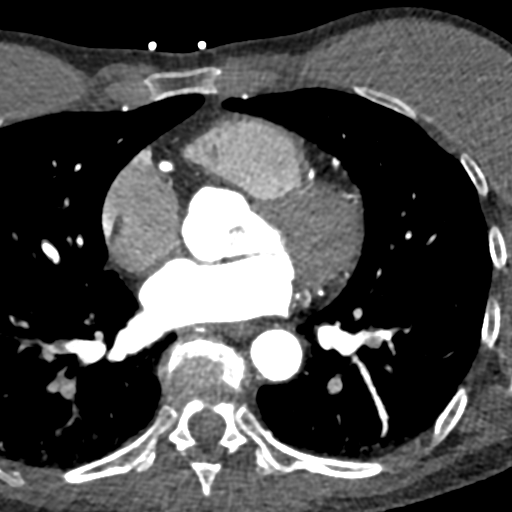

[Series 7: best syst · axial · 0.39mm/px · z∈[-152,-111]mm · 2 of 306 slices shown, 3 images]
[im 102/306  vessel]
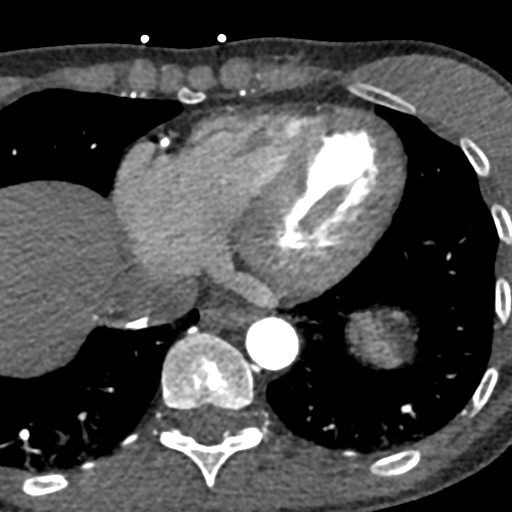
[im 102/306  lung]
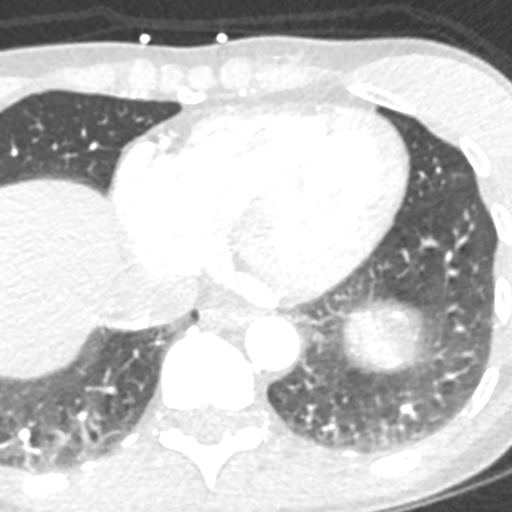
[im 204/306  vessel]
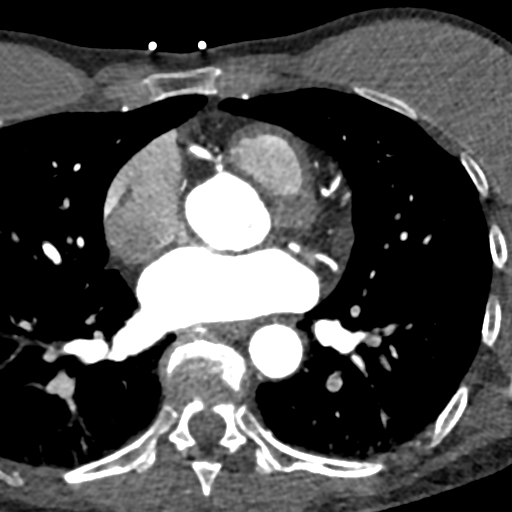

[Series 9: ts diast sharp 71 % · axial · 0.39mm/px · z∈[-152,-111]mm · 2 of 306 slices shown]
[im 102/306  lung]
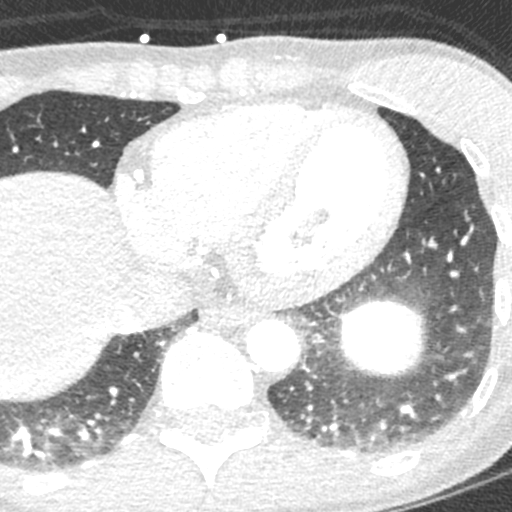
[im 204/306  lung]
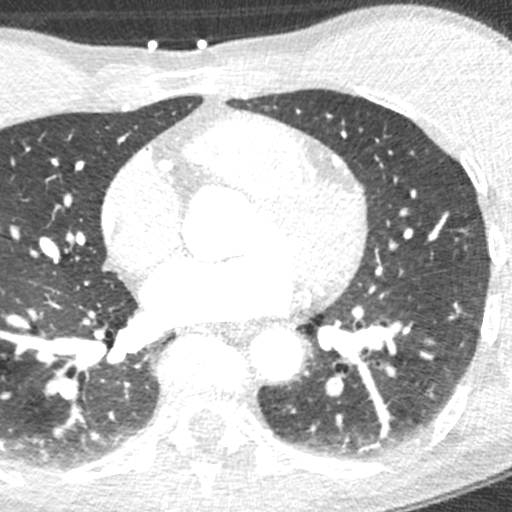

[Series 10: ts syst sharp · axial · 0.39mm/px · z∈[-152,-111]mm · 2 of 306 slices shown]
[im 102/306  lung]
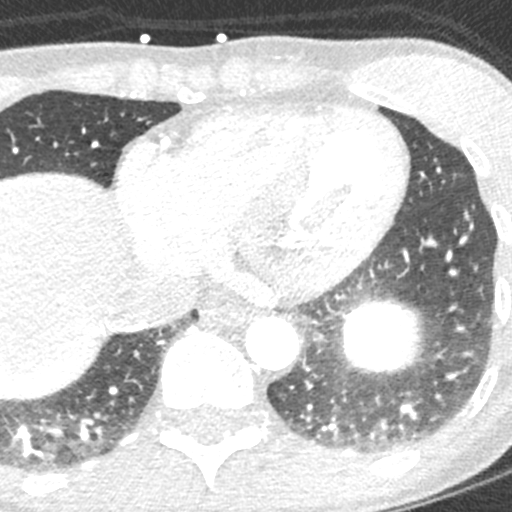
[im 204/306  lung]
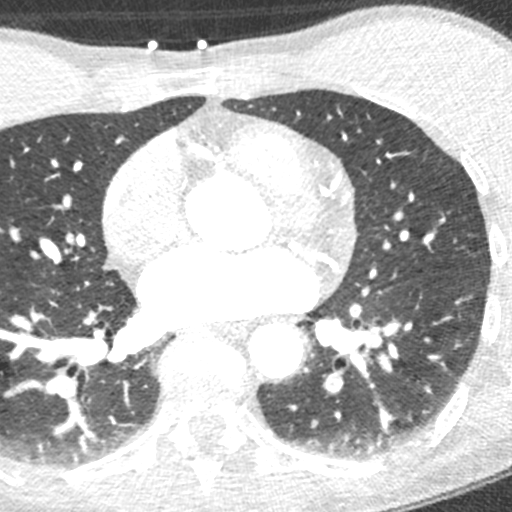

[8 of 20 positions shown; findings below may reference images not displayed]

FINDINGS: Vascular: Heart is normal size.  Aorta normal caliber.

Mediastinum/Nodes: No adenopathy

Lungs/Pleura: No confluent opacity or effusion.

Upper Abdomen: Imaging into the upper abdomen demonstrates no acute
findings.

Musculoskeletal: Bilateral breast implants partially visualized,
grossly unremarkable. No acute bony abnormality.
IMPRESSION: No acute or significant extracardiac abnormality.
FINDINGS: A 100 kV prospective scan was triggered in the descending thoracic
aorta at 111 HU's. Axial non-contrast 3 mm slices were carried out
through the heart. The data set was analyzed on a dedicated work
station and scored using the Agatson method. Gantry rotation speed
was 250 msecs and collimation was .6 mm. No beta blockade and 0.8 mg
of sl NTG was given. The 3D data set was reconstructed in 5%
intervals of the 67-82 % of the R-R cycle. Diastolic phases were
analyzed on a dedicated work station using MPR, MIP and VRT modes.
The patient received 80 cc of contrast.

Aorta:  Normal size.  No calcifications.  No dissection.

Aortic Valve:  Trileaflet.  No calcifications.

Coronary Arteries:  Normal coronary origin.  Right dominance.

RCA is a large dominant artery that gives rise to PDA and PLA. There
is no plaque.

Left main is a large artery that gives rise to LAD and LCX arteries.

LAD is a large vessel that has no plaque.

LCX is a non-dominant artery that gives rise to one large OM1
branch. There is no plaque.

Other findings:

Normal pulmonary vein drainage into the left atrium.

Normal left atrial appendage without a thrombus.

Normal size of the pulmonary artery.

There is a left to right shunt suspected to be a secundum atrial
septal defect- moderate defect measuring 6.91 mm.
IMPRESSION: 1. Coronary calcium score of 0. This was 0 percentile for age and
sex matched control.

2. Normal coronary origin with right dominance.

3. No evidence of CAD. CAD-RADS 0. No evidence of CAD (0%). Consider
non-atherosclerotic causes of chest pain.

4. There is a left to right shunt suspected to be a secundum atrial
septal defect - moderate defect measuring at 6.91 mm.

*** End of Addendum ***
EXAM:
OVER-READ INTERPRETATION  CT CHEST

The following report is an over-read performed by radiologist Dr.
Magtymguly Tuna [[email or web address]] on 07/04/2021. This over-read
does not include interpretation of cardiac or coronary anatomy or
pathology. The coronary CTA interpretation by the cardiologist is
attached.
FINDINGS: Vascular: Heart is normal size.  Aorta normal caliber.

Mediastinum/Nodes: No adenopathy

Lungs/Pleura: No confluent opacity or effusion.

Upper Abdomen: Imaging into the upper abdomen demonstrates no acute
findings.

Musculoskeletal: Bilateral breast implants partially visualized,
grossly unremarkable. No acute bony abnormality.
IMPRESSION: No acute or significant extracardiac abnormality.

## 2023-12-17 ENCOUNTER — Emergency Department (HOSPITAL_BASED_OUTPATIENT_CLINIC_OR_DEPARTMENT_OTHER)
Admission: EM | Admit: 2023-12-17 | Discharge: 2023-12-17 | Disposition: A | Discharge status: Home / Self Care | Payer: BC Managed Care – PPO | Attending: Emergency Medicine | Admitting: Emergency Medicine

## 2023-12-17 ENCOUNTER — Encounter (HOSPITAL_BASED_OUTPATIENT_CLINIC_OR_DEPARTMENT_OTHER): Payer: Self-pay | Admitting: Emergency Medicine

## 2023-12-17 ENCOUNTER — Other Ambulatory Visit: Payer: Self-pay

## 2023-12-17 ENCOUNTER — Other Ambulatory Visit (HOSPITAL_BASED_OUTPATIENT_CLINIC_OR_DEPARTMENT_OTHER): Payer: Self-pay

## 2023-12-17 ENCOUNTER — Emergency Department (HOSPITAL_BASED_OUTPATIENT_CLINIC_OR_DEPARTMENT_OTHER): Payer: BC Managed Care – PPO

## 2023-12-17 ENCOUNTER — Emergency Department (HOSPITAL_BASED_OUTPATIENT_CLINIC_OR_DEPARTMENT_OTHER): Payer: BC Managed Care – PPO | Admitting: Radiology

## 2023-12-17 DIAGNOSIS — J189 Pneumonia, unspecified organism: Secondary | ICD-10-CM | POA: Insufficient documentation

## 2023-12-17 DIAGNOSIS — R079 Chest pain, unspecified: Secondary | ICD-10-CM

## 2023-12-17 LAB — CBC
HCT: 42.5 % (ref 36.0–46.0)
Hemoglobin: 14.1 g/dL (ref 12.0–15.0)
MCH: 28.9 pg (ref 26.0–34.0)
MCHC: 33.2 g/dL (ref 30.0–36.0)
MCV: 87.1 fL (ref 80.0–100.0)
Platelets: 265 10*3/uL (ref 150–400)
RBC: 4.88 MIL/uL (ref 3.87–5.11)
RDW: 12.1 % (ref 11.5–15.5)
WBC: 4.3 10*3/uL (ref 4.0–10.5)
nRBC: 0 % (ref 0.0–0.2)

## 2023-12-17 LAB — BASIC METABOLIC PANEL
Anion gap: 7 (ref 5–15)
BUN: 13 mg/dL (ref 6–20)
CO2: 28 mmol/L (ref 22–32)
Calcium: 9.2 mg/dL (ref 8.9–10.3)
Chloride: 106 mmol/L (ref 98–111)
Creatinine, Ser: 0.8 mg/dL (ref 0.44–1.00)
GFR, Estimated: >60 mL/min (ref >60)
Glucose, Bld: 105 mg/dL — ABNORMAL HIGH (ref 70–99)
Potassium: 3.9 mmol/L (ref 3.5–5.1)
Sodium: 141 mmol/L (ref 135–145)

## 2023-12-17 LAB — TROPONIN I (HIGH SENSITIVITY): Troponin I (High Sensitivity): <2 ng/L (ref <18)

## 2023-12-17 MED ORDER — AMOXICILLIN-POT CLAVULANATE 875-125 MG PO TABS
1.0000 | ORAL_TABLET | Freq: Two times a day (BID) | ORAL | 0 refills | Status: AC
  Filled 2023-12-17: qty 14, 7d supply, fill #0

## 2023-12-17 MED ORDER — IOHEXOL 300 MG/ML  SOLN
100.0000 mL | Freq: Once | INTRAMUSCULAR | Status: AC | PRN
  Administered 2023-12-17: 75 mL via INTRAVENOUS

## 2023-12-17 NOTE — Discharge Instructions (Signed)
Your EKG and cardiac troponin was reassuring.  Your chest x-ray and CT imaging pointed towards a small pneumonia which is likely the cause of your discomfort and shortness of breath.  Will treat you with a course of antibiotics, recommend you follow-up with your primary care provider for outpatient imaging to ensure resolution, return for any severe worsening symptoms.  CT Chest: IMPRESSION:  1. A 2.2 cm area of consolidation in the anterior left upper lobe is  new since 2022. This is nonspecific although favored to be a small  infectious pneumonia. No pleural effusion. Recommend either PA and  lateral chest X-ray in 3-4 weeks following trial of antibiotic  therapy, or a repeat Chest CT in 2-3 months, to document resolution.  2. No other acute finding in the chest.

## 2023-12-17 NOTE — ED Triage Notes (Signed)
Pt c/o L sided chest pain radiating down L arm and to back for about 2 weeks. No numbness or tingling. Improves with rest.

## 2023-12-17 NOTE — ED Provider Notes (Signed)
[place] EMERGENCY DEPARTMENT AT [facility] Provider Note   CSN: [id number] Arrival date & time: 12/17/23  1914     History  Chief Complaint  Patient presents with   Chest Pain    Tammy Stuart is a 52 y.o. female.   Chest Pain    52 year old female with medical history significant for pulmonary nodules who presents to the emergency department with shortness of breath and chest discomfort.  The patient states that she has had pain in her chest, located on the left side, radiating down the left arm into her back for the past 2 weeks.  Pain is intermittent, sometimes exertional.  She endorses associated shortness of breath.  Denies any cough, fever or chills.  Has had intermittent weight loss and weight gain of around 10 pounds during that same time.  Home Medications Prior to Admission medications   Medication Sig Start Date End Date Taking? Authorizing Provider  amoxicillin-clavulanate (AUGMENTIN) 875-125 MG tablet Take 1 tablet by mouth every 12 (twelve) hours. 12/17/23  Yes Ernie Avena, MD  albuterol (VENTOLIN HFA) 108 (90 Base) MCG/ACT inhaler Inhale 2 puffs into the lungs every 6 (six) hours as needed for wheezing or shortness of breath. 05/04/21   [provider]  BREO ELLIPTA 200-25 MCG/INH AEPB Inhale 1 puff into the lungs daily. 05/31/21   [provider]  Cholecalciferol (VITAMIN D3) 50 MCG (2000 UT) TABS Take 1 tablet by mouth daily.    [provider]  COLLAGEN PO Take 2 capsules by mouth at bedtime.    [provider]  desloratadine (CLARINEX) 5 MG tablet Take 5 mg by mouth at bedtime. 09/25/17   [provider]  Ibuprofen-Acetaminophen (ADVIL DUAL ACTION) 125-250 MG TABS Take 2 mg by mouth at bedtime.    [provider]  magnesium gluconate (MAGONATE) 500 MG tablet Take 1,000 mg by mouth at bedtime.    [provider]  montelukast (SINGULAIR) 10 MG tablet Take 10 mg by mouth at bedtime. 07/31/16    [provider]  OVER THE COUNTER MEDICATION Take 1 capsule by mouth at bedtime. amberen    [provider]  OVER THE COUNTER MEDICATION Take 3 capsules by mouth at bedtime. Cleanse more    [provider]  Probiotic Product (ALIGN) 4 MG CAPS Take 1 capsule by mouth at bedtime.    [provider]  saw palmetto 500 MG capsule Take 500 mg by mouth at bedtime.    [provider]  Sodium Hyaluronate, oral, (HYALURONIC ACID) 100 MG CAPS Take 100 mg by mouth at bedtime.    [provider]      Allergies    Hydrocodone, Oxycodone-acetaminophen, and Oxycodone hcl    Review of Systems   Review of Systems  Cardiovascular:  Positive for chest pain.    Physical Exam Updated Vital Signs BP (!) 168/97 (BP Location: Right Arm)   Pulse 82   Temp 97.7 F (36.5 C) (Oral)   Resp 17   Ht 5\' 3"  (1.6 m)   Wt 61.2 kg   SpO2 100%   BMI 23.91 kg/m  Physical Exam Vitals and nursing note reviewed.  Constitutional:      General: She is not in acute distress.    Appearance: She is well-developed.  HENT:     Head: Normocephalic and atraumatic.  Eyes:     Conjunctiva/sclera: Conjunctivae normal.  Cardiovascular:     Rate and Rhythm: Normal rate and regular rhythm.  Pulmonary:  Effort: Pulmonary effort is normal. No respiratory distress.     Breath sounds: Normal breath sounds.  Abdominal:     Palpations: Abdomen is soft.     Tenderness: There is no abdominal tenderness.  Musculoskeletal:        General: No swelling.     Cervical back: Neck supple.  Skin:    General: Skin is warm and dry.     Capillary Refill: Capillary refill takes less than 2 seconds.  Neurological:     Mental Status: She is alert.  Psychiatric:        Mood and Affect: Mood normal.     ED Results / Procedures / Treatments   Labs (all labs ordered are listed, but only abnormal results are displayed) Labs Reviewed  BASIC METABOLIC PANEL - Abnormal; Notable for the  following components:      Result Value   Glucose, Bld 105 (*)    All other components within normal limits  CBC  TROPONIN I (HIGH SENSITIVITY)    EKG EKG Interpretation Date/Time:  Tuesday December 17 2023 08:39:10 EST Ventricular Rate:  78 PR Interval:  138 QRS Duration:  76 QT Interval:  370 QTC Calculation: 421 R Axis:   71  Text Interpretation: Normal sinus rhythm Normal ECG When compared with ECG of 15-May-2021 15:56, No significant change was found Confirmed by Ernie Avena ([id number]) on 12/17/2023 9:36:30 AM  Radiology CT Chest W Contrast Result Date: 12/17/2023 CLINICAL DATA:  52 year old female with left side chest pain radiating to the back and arm for 2 weeks. History of bilateral mastectomy, benign left lower lobe lung nodule. EXAM: CT CHEST WITH CONTRAST TECHNIQUE: Multidetector CT imaging of the chest was performed during intravenous contrast administration. RADIATION DOSE REDUCTION: This exam was performed according to the departmental dose-optimization program which includes automated exposure control, adjustment of the mA and/or kV according to patient size and/or use of iterative reconstruction technique. CONTRAST:  75mL OMNIPAQUE IOHEXOL 300 MG/ML  SOLN COMPARISON:  Chest radiographs this morning 0857 hours. Report of chest CT 12/29/2012 (no images available). Cardiac CT 07/04/2021. FINDINGS: Cardiovascular: Normal heart size. No pericardial effusion. Negative visible aorta. Mediastinum/Nodes: Negative.  No mediastinal mass lymphadenopathy. Lungs/Pleura: Larger lung volumes since 2022, major airways are patent. New triangular 2.2 cm area of consolidation in the anterior left upper lobe series 4, image 82. That area was clear in 2022. No pleural effusion. Calcified left lower lobe lateral basal segment granuloma is benign. Tiny middle lobe subpleural lung nodules bilaterally are stable since 2022 and benign (such as on series 4, image 91, no follow-up imaging recommended).  Otherwise only minimal dependent pulmonary atelectasis. Upper Abdomen: Negative visible liver, spleen, pancreas, adrenal glands, stomach, left kidney. Musculoskeletal: No acute or suspicious osseous lesion identified. Bilateral breast implants and/or reconstruction. IMPRESSION: 1. A 2.2 cm area of consolidation in the anterior left upper lobe is new since 2022. This is nonspecific although favored to be a small infectious pneumonia. No pleural effusion. Recommend either PA and lateral chest X-ray in 3-4 weeks following trial of antibiotic therapy, or a repeat Chest CT in 2-3 months, to document resolution. 2. No other acute finding in the chest. Electronically Signed   By: Odessa Fleming M.D.   On: 12/17/2023 10:50   DG Chest 2 View Result Date: 12/17/2023 CLINICAL DATA:  Chest pain. EXAM: CHEST - 2 VIEW COMPARISON:  May 15, 2021. FINDINGS: The heart size and mediastinal contours are within normal limits. Left perihilar nodular density is  noted concerning for possible neoplasm. Faint right midlung opacity is noted suggesting possible infiltrate or atelectasis. The visualized skeletal structures are unremarkable. IMPRESSION: Left perihilar nodular density is noted concerning for possible neoplasm; CT scan of the chest with intravenous contrast is recommended for further evaluation. Also noted is faint right midlung opacity which may represent possible infiltrate or atelectasis. Electronically Signed   By: Lupita Raider M.D.   On: 12/17/2023 09:21    Procedures Procedures    Medications Ordered in ED Medications  iohexol (OMNIPAQUE) 300 MG/ML solution 100 mL (75 mLs Intravenous Contrast Given 12/17/23 1017)    ED Course/ Medical Decision Making/ A&P             HEART Score: 1                    Medical Decision Making Amount and/or Complexity of Data Reviewed Labs: ordered. Radiology: ordered.  Risk Prescription drug management.     52 year old female with medical history significant for  pulmonary nodules who presents to the emergency department with shortness of breath and chest discomfort.  The patient states that she has had pain in her chest, located on the left side, radiating down the left arm into her back for the past 2 weeks.  Pain is intermittent, sometimes exertional.  She endorses associated shortness of breath.  Denies any cough, fever or chills.  Has had intermittent weight loss and weight gain of around 10 pounds during that same time.  On arrival, the patient was afebrile, not tachycardic or tachypneic, BP 168/97, saturating her percent on room air.  Sinus rhythm noted on cardiac telemetry.  Patient presenting with roughly 2 weeks of chest discomfort and associated shortness of breath.  No cough, fever or chills, intermittent weight fluctuation.  Lower concern for ACS, lower concern for PE, considered pneumonia.  Considered stable angina. Heart score of 1.  Initial EKG revealed sinus rhythm, ventricular rate 78, no acute ischemic changes or abnormal intervals.  A chest x-ray revealed a left perihilar nodular density noted concerning for possible neoplasm, CT of the chest with IV contrast was recommended.  Also faint right midlung opacity which may represent possible infiltrate or atelectasis.  CT chest with contrast: IMPRESSION:  1. A 2.2 cm area of consolidation in the anterior left upper lobe is  new since 2022. This is nonspecific although favored to be a small  infectious pneumonia. No pleural effusion. Recommend either PA and  lateral chest X-ray in 3-4 weeks following trial of antibiotic  therapy, or a repeat Chest CT in 2-3 months, to document resolution.  2. No other acute finding in the chest.   Labs: Cardiac troponin less than 2, CBC without a leukocytosis or anemia, BMP unremarkable.  Low concern for ACS, symptoms are ongoing for the last 2 weeks, initial troponin less than 2, do not think repeat troponin is indicated at this time, reassuring EKG.  CT  imaging revealing possible pneumonia which can explain the patient's symptoms.  Patient is vitally stable, afebrile, not tachycardic, saturating 100% on room air.  Will discharge the patient on a course of Augmentin and have her follow-up with her PCP for repeat outpatient imaging to ensure resolution.  Return precautions provided in the event of any worsening symptoms.   Final Clinical Impression(s) / ED Diagnoses Final diagnoses:  Community acquired pneumonia, unspecified laterality  Chest pain, unspecified type    Rx / DC Orders ED Discharge Orders  Ordered    amoxicillin-clavulanate (AUGMENTIN) 875-125 MG tablet  Every 12 hours        12/17/23 1103              Ernie Avena, MD 12/17/23 1108

## 2023-12-31 DIAGNOSIS — J189 Pneumonia, unspecified organism: Secondary | ICD-10-CM | POA: Diagnosis not present

## 2024-01-14 DIAGNOSIS — R911 Solitary pulmonary nodule: Secondary | ICD-10-CM | POA: Diagnosis not present

## 2024-01-23 DIAGNOSIS — Q211 Atrial septal defect, unspecified: Secondary | ICD-10-CM | POA: Diagnosis not present

## 2024-01-23 DIAGNOSIS — Z8249 Family history of ischemic heart disease and other diseases of the circulatory system: Secondary | ICD-10-CM | POA: Diagnosis not present

## 2024-01-24 DIAGNOSIS — R911 Solitary pulmonary nodule: Secondary | ICD-10-CM | POA: Diagnosis not present

## 2024-01-27 DIAGNOSIS — R232 Flushing: Secondary | ICD-10-CM | POA: Diagnosis not present

## 2024-01-27 DIAGNOSIS — Z01419 Encounter for gynecological examination (general) (routine) without abnormal findings: Secondary | ICD-10-CM | POA: Diagnosis not present

## 2024-01-27 DIAGNOSIS — R87615 Unsatisfactory cytologic smear of cervix: Secondary | ICD-10-CM | POA: Diagnosis not present

## 2024-01-27 DIAGNOSIS — Z124 Encounter for screening for malignant neoplasm of cervix: Secondary | ICD-10-CM | POA: Diagnosis not present

## 2024-02-03 DIAGNOSIS — G43509 Persistent migraine aura without cerebral infarction, not intractable, without status migrainosus: Secondary | ICD-10-CM | POA: Diagnosis not present

## 2024-02-07 DIAGNOSIS — I517 Cardiomegaly: Secondary | ICD-10-CM | POA: Diagnosis not present

## 2024-02-26 DIAGNOSIS — R911 Solitary pulmonary nodule: Secondary | ICD-10-CM | POA: Diagnosis not present

## 2024-02-26 DIAGNOSIS — R55 Syncope and collapse: Secondary | ICD-10-CM | POA: Diagnosis not present

## 2024-02-26 DIAGNOSIS — R0602 Shortness of breath: Secondary | ICD-10-CM | POA: Diagnosis not present

## 2024-02-27 DIAGNOSIS — R911 Solitary pulmonary nodule: Secondary | ICD-10-CM | POA: Diagnosis not present

## 2024-02-27 DIAGNOSIS — I7300 Raynaud's syndrome without gangrene: Secondary | ICD-10-CM | POA: Diagnosis not present

## 2024-02-27 DIAGNOSIS — J324 Chronic pansinusitis: Secondary | ICD-10-CM | POA: Diagnosis not present

## 2024-03-02 DIAGNOSIS — R221 Localized swelling, mass and lump, neck: Secondary | ICD-10-CM | POA: Diagnosis not present

## 2024-03-10 DIAGNOSIS — J4530 Mild persistent asthma, uncomplicated: Secondary | ICD-10-CM | POA: Diagnosis not present

## 2024-05-05 DIAGNOSIS — Z780 Asymptomatic menopausal state: Secondary | ICD-10-CM | POA: Diagnosis not present

## 2024-05-05 DIAGNOSIS — E2839 Other primary ovarian failure: Secondary | ICD-10-CM | POA: Diagnosis not present

## 2024-06-10 DIAGNOSIS — J4530 Mild persistent asthma, uncomplicated: Secondary | ICD-10-CM | POA: Diagnosis not present

## 2024-07-01 DIAGNOSIS — J4530 Mild persistent asthma, uncomplicated: Secondary | ICD-10-CM | POA: Diagnosis not present

## 2024-07-10 DIAGNOSIS — K635 Polyp of colon: Secondary | ICD-10-CM | POA: Diagnosis not present

## 2024-07-10 DIAGNOSIS — Z1211 Encounter for screening for malignant neoplasm of colon: Secondary | ICD-10-CM | POA: Diagnosis not present

## 2024-07-10 DIAGNOSIS — K6389 Other specified diseases of intestine: Secondary | ICD-10-CM | POA: Diagnosis not present

## 2024-07-10 DIAGNOSIS — D124 Benign neoplasm of descending colon: Secondary | ICD-10-CM | POA: Diagnosis not present

## 2024-07-10 DIAGNOSIS — R1013 Epigastric pain: Secondary | ICD-10-CM | POA: Diagnosis not present

## 2024-08-07 DIAGNOSIS — E2839 Other primary ovarian failure: Secondary | ICD-10-CM | POA: Diagnosis not present

## 2024-08-07 DIAGNOSIS — Z780 Asymptomatic menopausal state: Secondary | ICD-10-CM | POA: Diagnosis not present

## 2024-09-22 DIAGNOSIS — M25571 Pain in right ankle and joints of right foot: Secondary | ICD-10-CM | POA: Diagnosis not present

## 2024-09-22 DIAGNOSIS — M7712 Lateral epicondylitis, left elbow: Secondary | ICD-10-CM | POA: Diagnosis not present

## 2024-09-24 DIAGNOSIS — J4530 Mild persistent asthma, uncomplicated: Secondary | ICD-10-CM | POA: Diagnosis not present

## 2024-10-12 DIAGNOSIS — M2011 Hallux valgus (acquired), right foot: Secondary | ICD-10-CM | POA: Diagnosis not present

## 2024-10-12 DIAGNOSIS — M2042 Other hammer toe(s) (acquired), left foot: Secondary | ICD-10-CM | POA: Diagnosis not present

## 2024-10-12 DIAGNOSIS — M21611 Bunion of right foot: Secondary | ICD-10-CM | POA: Diagnosis not present

## 2024-10-12 DIAGNOSIS — M21621 Bunionette of right foot: Secondary | ICD-10-CM | POA: Diagnosis not present

## 2024-10-12 DIAGNOSIS — M21622 Bunionette of left foot: Secondary | ICD-10-CM | POA: Diagnosis not present

## 2024-10-12 DIAGNOSIS — M79672 Pain in left foot: Secondary | ICD-10-CM | POA: Diagnosis not present

## 2024-10-12 DIAGNOSIS — M79671 Pain in right foot: Secondary | ICD-10-CM | POA: Diagnosis not present

## 2024-10-12 DIAGNOSIS — M2041 Other hammer toe(s) (acquired), right foot: Secondary | ICD-10-CM | POA: Diagnosis not present

## 2024-10-12 DIAGNOSIS — M2012 Hallux valgus (acquired), left foot: Secondary | ICD-10-CM | POA: Diagnosis not present

## 2024-10-12 DIAGNOSIS — M21612 Bunion of left foot: Secondary | ICD-10-CM | POA: Diagnosis not present

## 2024-10-28 DIAGNOSIS — H2513 Age-related nuclear cataract, bilateral: Secondary | ICD-10-CM | POA: Diagnosis not present
